# Patient Record
Sex: Female | Born: 1965 | Race: White | Hispanic: No | Marital: Married | Smoking: Former smoker
Health system: Southern US, Community
[De-identification: ages and names within clinical notes are randomized; demographics above are authoritative.]

## PROBLEM LIST (undated history)

## (undated) DIAGNOSIS — F32A Depression, unspecified: Secondary | ICD-10-CM

## (undated) DIAGNOSIS — F329 Major depressive disorder, single episode, unspecified: Secondary | ICD-10-CM

## (undated) DIAGNOSIS — I10 Essential (primary) hypertension: Secondary | ICD-10-CM

## (undated) HISTORY — PX: OTHER SURGICAL HISTORY: SHX169

## (undated) HISTORY — DX: Essential (primary) hypertension: I10

## (undated) HISTORY — PX: CHOLECYSTECTOMY: SHX55

---

## 2000-07-22 ENCOUNTER — Other Ambulatory Visit: Admission: RE | Admit: 2000-07-22 | Discharge: 2000-07-22 | Payer: Self-pay | Admitting: *Deleted

## 2001-09-06 ENCOUNTER — Encounter: Payer: Self-pay | Admitting: Family Medicine

## 2001-09-06 ENCOUNTER — Inpatient Hospital Stay (HOSPITAL_COMMUNITY): Admission: EM | Admit: 2001-09-06 | Discharge: 2001-09-08 | Payer: Self-pay | Admitting: Internal Medicine

## 2001-09-07 ENCOUNTER — Encounter: Payer: Self-pay | Admitting: Family Medicine

## 2002-01-24 ENCOUNTER — Emergency Department (HOSPITAL_COMMUNITY): Admission: EM | Admit: 2002-01-24 | Discharge: 2002-01-24 | Payer: Self-pay | Admitting: Emergency Medicine

## 2003-03-28 ENCOUNTER — Ambulatory Visit (HOSPITAL_COMMUNITY): Admission: RE | Admit: 2003-03-28 | Discharge: 2003-03-28 | Payer: Self-pay | Admitting: Internal Medicine

## 2005-01-11 ENCOUNTER — Emergency Department (HOSPITAL_COMMUNITY): Admission: EM | Admit: 2005-01-11 | Discharge: 2005-01-11 | Payer: Self-pay | Admitting: *Deleted

## 2006-11-27 ENCOUNTER — Other Ambulatory Visit: Admission: RE | Admit: 2006-11-27 | Discharge: 2006-11-27 | Payer: Self-pay | Admitting: Obstetrics & Gynecology

## 2007-08-24 ENCOUNTER — Ambulatory Visit: Payer: Self-pay | Admitting: Internal Medicine

## 2007-08-26 ENCOUNTER — Ambulatory Visit (HOSPITAL_COMMUNITY): Admission: RE | Admit: 2007-08-26 | Discharge: 2007-08-26 | Payer: Self-pay | Admitting: Internal Medicine

## 2008-02-12 ENCOUNTER — Ambulatory Visit (HOSPITAL_COMMUNITY): Admission: RE | Admit: 2008-02-12 | Discharge: 2008-02-12 | Payer: Self-pay | Admitting: Family Medicine

## 2010-02-25 ENCOUNTER — Encounter: Payer: Self-pay | Admitting: Family Medicine
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
## 2010-06-22 NOTE — Op Note (Signed)
NAME:  Julie Christensen, Julie Christensen                          ACCOUNT NO.:  [id number]   MEDICAL RECORD NO.:  [id number]                   PATIENT TYPE:  AMB   LOCATION:  DAY                                  FACILITY:  [facility]   PHYSICIAN:  Lionel December, M.D.                 DATE OF BIRTH:  27-Apr-1965   DATE OF PROCEDURE:  03/28/2003  DATE OF DISCHARGE:                                 OPERATIVE REPORT   PROCEDURE:  Total colonoscopy.   ENDOSCOPIST:  Lionel December, M.D.   INDICATIONS:  This patient is 45 year old Caucasian female who has noted  change in her bowel habits.  She is noted to have heme-positive stools.  She  is, therefore, undergoing a diagnostic colonoscopy  Family history is  negative for colorectal carcinoma or inflammatory bowel disease.  Procedure  and risks were reviewed with the patient and informed consent was obtained.   PREOPERATIVE MEDICATIONS:  Demerol 50 mg IV and Versed 6 mg IV in divided  dose.   FINDINGS:  Procedure performed in endoscopy suite.  The patient's vital  signs and O2 saturation were monitored during the procedure and remained  stable.  The patient was placed in the left lateral recumbent position and  rectal examination was performed.  She had a small tag anteriorly.  Digital  exam was otherwise normal.   The scope was placed in the rectum and advanced under vision into the  sigmoid colon and beyond.  Preparation was satisfactory.  The scope was  passed to the cecum which was identified by ileocecal valve and appendiceal  orifice.  A short segment of TL was also examined and was normal.  The  colonic mucosa was once again carefully examined on the way out and was  normal throughout. The rectal mucosa similarly was normal.   The scope was retroflexed to examine the anorectal junction and small  hemorrhoids were noted below the dentate line.  The endoscope was  straightened and withdrawn.  The patient tolerated the procedure well.   FINAL DIAGNOSES:  1.  Small external hemorrhoids, otherwise normal colonoscopy and terminal     ileoscopy.  2. Suspect that she has irritable bowel syndrome.   RECOMMENDATIONS:  1. High fiber diet.  2. Citrucel 1 tablespoonful daily.  3. Colace 1-2 tablets at bedtime.  4. If this regimen does not help could try low-dose Zelnorm.      ___________________________________________                                            Lionel December, M.D.   NR/MEDQ  D:  03/28/2003  T:  03/28/2003  Job:  [id number]   cc:   Kirk Ruths, M.D.  [address]  [place]  [place] [address]  Fax: [phone]

## 2010-06-22 NOTE — Discharge Summary (Signed)
   NAME:  Julie Christensen, Julie Christensen                          ACCOUNT NO.:  [id number]   MEDICAL RECORD NO.:  [id number]                   PATIENT TYPE:  INP   LOCATION:  A305                                 FACILITY:  [facility]   PHYSICIAN:  Dalia Heading, M.D.               DATE OF BIRTH:  Mar 13, 1965   DATE OF ADMISSION:  DATE OF DISCHARGE:  09/08/2001                                 DISCHARGE SUMMARY   HOSPITAL COURSE:  The patient is a 45 year old white female who presented to  the hospital with right upper quadrant pain, nausea and vomiting.  She was  noted on CT scan of the abdomen to have cholelithiasis without common bile  duct dilatation. She also was noted to have mild chemical pancreatitis.  A  surgery consultation was obtained and the patient was taken to the operating  room on 09/07/01 and underwent a laparoscopic cholecystectomy with  cholangiograms.  The cholangiograms were negative for a common bile duct  stone.  She tolerated the procedure well. Her postoperative course was  unremarkable.  Her diet was advanced without difficulty.   The patient is being discharged home on postoperative day 1 in good and  improving condition.   DISCHARGE INSTRUCTIONS:  The patient is to follow up with Dr. Franky Macho  on 09/15/01.   DISCHARGE MEDICATIONS:  Percocet 7.5 mg 1-2 tablets p.o. q.6h. p.r.n. pain,  dispense 40, no refills.   PRINCIPAL DIAGNOSIS:  1. Cholecystitis, cholelithiasis.  2. Pancreatitis.   PRINCIPAL PROCEDURE:  Laparoscopic cholecystectomy with cholangiograms on  09/07/01.                                               Dalia Heading, M.D.    MAJ/MEDQ  D:  09/08/2001  T:  09/12/2001  Job:  [id number]   cc:   Jonell Cluck, M.D.

## 2010-06-22 NOTE — H&P (Signed)
   NAME:  Julie Christensen, Julie Christensen                          ACCOUNT NO.:  [id number]   MEDICAL RECORD NO.:  [id number]                   PATIENT TYPE:  INP   LOCATION:  A305                                 FACILITY:  [facility]   PHYSICIAN:  Jonell Cluck, M.D.              DATE OF BIRTH:  03/09/1965   DATE OF ADMISSION:  09/06/2001  DATE OF DISCHARGE:                                HISTORY & PHYSICAL   CHIEF COMPLAINT:  Abdominal pain.   HISTORY OF PRESENT ILLNESS:  This is a 44 year old female with an  essentially negative past history.  Her only medications at this time are  oral contraceptives.   The patient presented to the emergency department with the acute onset of  epigastric and right upper quadrant pain radiating to her right subscapular  region several hours after ingesting cheese fries and a large glass of milk.  She experienced nausea and vomiting and increasing severe pain and was  brought to the emergency room by her family.  Workup there was essentially  negative.  She continued to experience pain and was admitted for further  evaluation therapy.   There is no history of having neurologic deficits, chest pain, shortness of  breath, melena, hematemesis, hematochezia, genitourinary symptoms, or other  symptoms.  She does relate approximately one-year history of intermittent  right upper quadrant abdominal pain with no particular pattern.   CURRENT MEDICATIONS:  Oral contraceptives.   ALLERGIES:  SULFA.   PAST MEDICAL HISTORY:  Essentially benign.   FAMILY HISTORY:  Positive for gallbladder disease in a 4 year old brother.   REVIEW OF SYSTEMS:  Negative symptoms mentioned.   SOCIAL HISTORY:  She is a smoker and occasional alcohol only.   PHYSICAL EXAMINATION:  GENERAL:  Very pleasant female in no acute distress.  VITAL SIGNS:  On presentation, temperature 97.3, pulse 61, respirations 16,  blood pressure 129/89.  HEENT:  Normocephalic, atraumatic.  There is no icterus.   Ears, nose, and  throat are benign.  NECK:  Supple.  LUNGS:  Clear.  HEART:  Heart sounds are normal without murmurs, rubs, or gallops.  ABDOMEN:  Moderate epigastric tenderness with a positive Murphy's sign.  There is no significant guarding or rebound.  Bowel sounds are intact.  EXTREMITIES:  No clubbing, cyanosis, or edema.  NEUROLOGICAL:  Without focal deficits.   ASSESSMENT:  Probable biliary colic.   PLAN:  CT scan of the abdomen.  Possible surgical consult.  Ultrasound if  needed.  Will follow and treat.                                               Jonell Cluck, M.D.    MSC/MEDQ  D:  09/07/2001  T:  09/08/2001  Job:  [id number]

## 2010-06-22 NOTE — Op Note (Signed)
NAME:  Julie Christensen, Julie Christensen                          ACCOUNT NO.:  [id number]   MEDICAL RECORD NO.:  [id number]                   PATIENT TYPE:  INP   LOCATION:  A305                                 FACILITY:  [facility]   PHYSICIAN:  Dalia Heading, M.D.               DATE OF BIRTH:  11-25-1965   DATE OF PROCEDURE:  09/07/2001  DATE OF DISCHARGE:                                 OPERATIVE REPORT   PREOPERATIVE DIAGNOSES:  Acute cholecystitis, cholelithiasis, pancreatitis.   POSTOPERATIVE DIAGNOSES:  Acute cholecystitis, cholelithiasis, pancreatitis.   PROCEDURE:  Laparoscopic cholecystectomy with cholangiogram.   SURGEON:  Dalia Heading, M.D.   ASSISTANT:  Bernerd Limbo. Leona Carry, M.D.   ANESTHESIA:  General endotracheal.   INDICATIONS FOR PROCEDURE:  The patient is a 45 year old white female who  presented yesterday with nausea, vomiting, and right upper quadrant  abdominal pain. She was noted to have mild pancreatitis with elevated  amylase. CT scan of the abdomen and pelvis revealed cholelithiasis. The  risks and benefits of the procedure including bleeding, infection,  hepatobiliary injury, and the possibility of an open procedure were fully  explained to the patient, who gave informed consent.   DESCRIPTION OF PROCEDURE:  The patient was placed in the supine position.  After induction general endotracheal anesthesia, the abdomen was prepped and  draped using the usual sterile technique with Betadine.   A supraumbilical incision was made down to the fascia. A Veress needle was  introduced into the abdominal cavity and confirmation of placement was done  using the saline drop test. The abdomen was entered, inflated to 16 mmHg  pressure. An 11 mm trocar was introduced into the abdominal cavity under  direct visualization without difficulty. The patient was placed in reverse  Trendelenburg position and an additional 11 mm trocar was placed in the  epigastric region and 5 mm trocars were  placed in the right upper quadrant  and right flank regions. The liver was inspected and noted to be within  normal limits. The gallbladder was retracted superior and laterally. The  dissection was begun around the infundibulum of the gallbladder. The cystic  duct was first identified. Its juncture to the infundibulum fully  identified. A single Endoclip was placed proximally on the cystic duct. An  incision was made into the cystic duct and the cholangiocatheter was then  inserted. Under fluoroscopy, cholangiograms were performed. The  hepatobiliary tree appeared to be within normal limits. No filling defect  was noted. The dye flowed freely into the duodenum. The cholangiocatheter  was removed after the system was flushed with saline. Endoclips were placed  distally on the cystic duct and cystic duct was divided. The cystic artery  was likewise ligated and divided. The gallbladder was freed away from the  gallbladder fossa using Bovie electrocautery. The gallbladder was delivered  through the epigastric trocar site using an EndoCatch bag without  difficulty. The  gallbladder fossa was inspected and no abnormal bleeding  bile leakage was noted. Surgicel was placed in the gallbladder fossa. The  subhepatic space as well as the right hepatic gutter were irrigated with  normal saline. All fluid and air were then evacuated from the abdominal  cavity prior to removal of the trocars.   All wounds were irrigated with normal saline. All wounds were injected with  0.5% Sensorcaine. The supraumbilical fascia was reapproximated using an #0  Vicryl interrupted suture. All skin incisions were closed using staples.  Betadine ointment and dry sterile dressings were applied.   All tape and needle counts were correct at the end of the procedure. The  patient was extubated in the operating room and went back to the recovery  room awake in stable condition.   COMPLICATIONS:  None.   SPECIMENS:   Gallbladder with stones.   ESTIMATED BLOOD LOSS:  Minimal.                                               Dalia Heading, M.D.    MAJ/MEDQ  D:  09/07/2001  T:  09/11/2001  Job:  [id number]   cc:   Jonell Cluck, M.D.
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
## 2012-06-02 ENCOUNTER — Encounter (HOSPITAL_COMMUNITY): Payer: Self-pay | Admitting: Emergency Medicine

## 2012-06-02 ENCOUNTER — Emergency Department (HOSPITAL_COMMUNITY): Payer: BC Managed Care – PPO

## 2012-06-02 ENCOUNTER — Emergency Department (HOSPITAL_COMMUNITY)
Admission: EM | Admit: 2012-06-02 | Discharge: 2012-06-02 | Disposition: A | Discharge status: Home / Self Care | Payer: BC Managed Care – PPO | Attending: Emergency Medicine | Admitting: Emergency Medicine

## 2012-06-02 DIAGNOSIS — Z79899 Other long term (current) drug therapy: Secondary | ICD-10-CM | POA: Insufficient documentation

## 2012-06-02 DIAGNOSIS — K589 Irritable bowel syndrome without diarrhea: Secondary | ICD-10-CM | POA: Insufficient documentation

## 2012-06-02 DIAGNOSIS — K5900 Constipation, unspecified: Secondary | ICD-10-CM | POA: Insufficient documentation

## 2012-06-02 DIAGNOSIS — R5381 Other malaise: Secondary | ICD-10-CM | POA: Insufficient documentation

## 2012-06-02 DIAGNOSIS — R110 Nausea: Secondary | ICD-10-CM | POA: Insufficient documentation

## 2012-06-02 DIAGNOSIS — Z8659 Personal history of other mental and behavioral disorders: Secondary | ICD-10-CM | POA: Insufficient documentation

## 2012-06-02 DIAGNOSIS — R141 Gas pain: Secondary | ICD-10-CM | POA: Insufficient documentation

## 2012-06-02 DIAGNOSIS — K921 Melena: Secondary | ICD-10-CM | POA: Insufficient documentation

## 2012-06-02 DIAGNOSIS — Z3202 Encounter for pregnancy test, result negative: Secondary | ICD-10-CM | POA: Insufficient documentation

## 2012-06-02 DIAGNOSIS — R197 Diarrhea, unspecified: Secondary | ICD-10-CM | POA: Insufficient documentation

## 2012-06-02 DIAGNOSIS — R142 Eructation: Secondary | ICD-10-CM | POA: Insufficient documentation

## 2012-06-02 DIAGNOSIS — F17200 Nicotine dependence, unspecified, uncomplicated: Secondary | ICD-10-CM | POA: Insufficient documentation

## 2012-06-02 HISTORY — DX: Depression, unspecified: F32.A

## 2012-06-02 HISTORY — DX: Major depressive disorder, single episode, unspecified: F32.9

## 2012-06-02 LAB — URINALYSIS, ROUTINE W REFLEX MICROSCOPIC
Bilirubin Urine: NEGATIVE
Glucose, UA: NEGATIVE mg/dL
Hgb urine dipstick: NEGATIVE
Specific Gravity, Urine: 1.007 (ref 1.005–1.030)
pH: 6 (ref 5.0–8.0)

## 2012-06-02 LAB — COMPREHENSIVE METABOLIC PANEL
ALT: 11 U/L (ref 0–35)
AST: 18 U/L (ref 0–37)
Albumin: 3.6 g/dL (ref 3.5–5.2)
Alkaline Phosphatase: 49 U/L (ref 39–117)
Glucose, Bld: 115 mg/dL — ABNORMAL HIGH (ref 70–99)
Potassium: 3.8 mEq/L (ref 3.5–5.1)
Sodium: 138 mEq/L (ref 135–145)
Total Protein: 7.5 g/dL (ref 6.0–8.3)

## 2012-06-02 LAB — CBC WITH DIFFERENTIAL/PLATELET
Basophils Absolute: 0 10*3/uL (ref 0.0–0.1)
Eosinophils Absolute: 0.1 10*3/uL (ref 0.0–0.7)
Lymphs Abs: 2.8 10*3/uL (ref 0.7–4.0)
MCH: 31.1 pg (ref 26.0–34.0)
Neutrophils Relative %: 56 % (ref 43–77)
Platelets: 235 10*3/uL (ref 150–400)
RBC: 5.11 MIL/uL (ref 3.87–5.11)
RDW: 12.7 % (ref 11.5–15.5)
WBC: 7.4 10*3/uL (ref 4.0–10.5)

## 2012-06-02 LAB — POCT PREGNANCY, URINE: Preg Test, Ur: NEGATIVE

## 2012-06-02 LAB — LACTIC ACID, PLASMA: Lactic Acid, Venous: 1.3 mmol/L (ref 0.5–2.2)

## 2012-06-02 MED ORDER — MORPHINE SULFATE 4 MG/ML IJ SOLN
4.0000 mg | Freq: Once | INTRAMUSCULAR | Status: AC
  Administered 2012-06-02: 4 mg via INTRAVENOUS
  Filled 2012-06-02: qty 1

## 2012-06-02 MED ORDER — ONDANSETRON 4 MG PO TBDP
ORAL_TABLET | ORAL | Status: AC
  Administered 2012-06-02: 4 mg
  Filled 2012-06-02: qty 1

## 2012-06-02 MED ORDER — DICYCLOMINE HCL 20 MG PO TABS: 20.0000 mg | ORAL_TABLET | Freq: Four times a day (QID) | ORAL | Status: DC

## 2012-06-02 MED ORDER — IOHEXOL 300 MG/ML  SOLN
100.0000 mL | Freq: Once | INTRAMUSCULAR | Status: AC | PRN
  Administered 2012-06-02: 100 mL via INTRAVENOUS

## 2012-06-02 MED ORDER — IOHEXOL 300 MG/ML  SOLN
50.0000 mL | Freq: Once | INTRAMUSCULAR | Status: AC | PRN
  Administered 2012-06-02: 50 mL via ORAL

## 2012-06-02 MED ORDER — SODIUM CHLORIDE 0.9 % IV BOLUS (SEPSIS)
1000.0000 mL | Freq: Once | INTRAVENOUS | Status: AC
  Administered 2012-06-02: 1000 mL via INTRAVENOUS

## 2012-06-02 NOTE — ED Provider Notes (Signed)
I saw and evaluated the patient, reviewed the resident's note and I agree with the findings and plan. On my exam this patient with a long history of abdominal pain, hemorrhoids was in no distress, hemodynamically stable.  The patient had a largely unremarkable evaluation, was discharged with primary care and gastroenterology followup.  Gerhard Munch, MD 06/02/12 2001

## 2012-06-02 NOTE — ED Notes (Signed)
States has either constipation or diarrhea x 1 year states it keeps getting worse  Has hemmorrids  Major gas

## 2012-06-02 NOTE — ED Notes (Signed)
Also states that she has had shooting pains in her vagina

## 2012-06-02 NOTE — Discharge Instructions (Signed)
Diet and Irritable Bowel Syndrome  °No cure has been found for irritable bowel syndrome (IBS). Many options are available to treat the symptoms. Your caregiver will give you the best treatments available for your symptoms. He or she will also encourage you to manage stress and to make changes to your diet. You need to work with your caregiver and Registered Dietician to find the best combination of medicine, diet, counseling, and support to control your symptoms. The following are some diet suggestions. °FOODS THAT MAKE IBS WORSE °· Fatty foods, such as French fries. °· Milk products, such as cheese or ice cream. °· Chocolate. °· Alcohol. °· Caffeine (found in coffee and some sodas). °· Carbonated drinks, such as soda. °If certain foods cause symptoms, you should eat less of them or stop eating them. °FOOD JOURNAL  °· Keep a journal of the foods that seem to cause distress. Write down: °· What you are eating during the day and when. °· What problems you are having after eating. °· When the symptoms occur in relation to your meals. °· What foods always make you feel badly. °· Take your notes with you to your caregiver to see if you should stop eating certain foods. °FOODS THAT MAKE IBS BETTER °Fiber reduces IBS symptoms, especially constipation, because it makes stools soft, bulky, and easier to pass. Fiber is found in bran, bread, cereal, beans, fruit, and vegetables. Examples of foods with fiber include: °· Apples. °· Peaches. °· Pears. °· Berries. °· Figs. °· Broccoli, raw. °· Cabbage. °· Carrots. °· Raw peas. °· Kidney beans. °· Lima beans. °· Whole-grain bread. °· Whole-grain cereal. °Add foods with fiber to your diet a little at a time. This will let your body get used to them. Too much fiber at once might cause gas and swelling of your abdomen. This can trigger symptoms in a person with IBS. Caregivers usually recommend a diet with enough fiber to produce soft, painless bowel movements. High fiber diets may  cause gas and bloating. However, these symptoms often go away within a few weeks, as your body adjusts. °In many cases, dietary fiber may lessen IBS symptoms, particularly constipation. However, it may not help pain or diarrhea. High fiber diets keep the colon mildly enlarged (distended) with the added fiber. This may help prevent spasms in the colon. Some forms of fiber also keep water in the stool, thereby preventing hard stools that are difficult to pass.  °Besides telling you to eat more foods with fiber, your caregiver may also tell you to get more fiber by taking a fiber pill or drinking water mixed with a special high fiber powder. An example of this is a natural fiber laxative containing psyllium seed.  °TIPS °· Large meals can cause cramping and diarrhea in people with IBS. If this happens to you, try eating 4 or 5 small meals a day, or try eating less at each of your usual 3 meals. It may also help if your meals are low in fat and high in carbohydrates. Examples of carbohydrates are pasta, rice, whole-grain breads and cereals, fruits, and vegetables. °· If dairy products cause your symptoms to flare up, you can try eating less of those foods. You might be able to handle yogurt better than other dairy products, because it contains bacteria that helps with digestion. Dairy products are an important source of calcium and other nutrients. If you need to avoid dairy products, be sure to talk with a Registered Dietitian about getting these nutrients   through other food sources.  Drink enough water and fluids to keep your urine clear or pale yellow. This is important, especially if you have diarrhea. FOR MORE INFORMATION  International Foundation for Functional Gastrointestinal Disorders: www.iffgd.org  National Digestive Diseases Information Clearinghouse: digestive.StageSync.si Document Released: 04/13/2003 Document Revised: 04/15/2011 Document Reviewed: 12/29/2006 [facility] Patient Information 2013  [place], [place].  Irritable Bowel Syndrome Irritable Bowel Syndrome (IBS) is caused by a disturbance of normal bowel function. Other terms used are spastic colon, mucous colitis, and irritable colon. It does not require surgery, nor does it lead to cancer. There is no cure for IBS. But with proper diet, stress reduction, and medication, you will find that your problems (symptoms) will gradually disappear or improve. IBS is a common digestive disorder. It usually appears in late adolescence or early adulthood. Women develop it twice as often as men. CAUSES  After food has been digested and absorbed in the small intestine, waste material is moved into the colon (large intestine). In the colon, water and salts are absorbed from the undigested products coming from the small intestine. The remaining residue, or fecal material, is held for elimination. Under normal circumstances, gentle, rhythmic contractions on the bowel walls push the fecal material along the colon towards the rectum. In IBS, however, these contractions are irregular and poorly coordinated. The fecal material is either retained too long, resulting in constipation, or expelled too soon, producing diarrhea. SYMPTOMS  The most common symptom of IBS is pain. It is typically in the lower left side of the belly (abdomen). But it may occur anywhere in the abdomen. It can be felt as heartburn, backache, or even as a dull pain in the arms or shoulders. The pain comes from excessive bowel-muscle spasms and from the buildup of gas and fecal material in the colon. This pain:  Can range from sharp belly (abdominal) cramps to a dull, continuous ache.  Usually worsens soon after eating.  Is typically relieved by having a bowel movement or passing gas. Abdominal pain is usually accompanied by constipation. But it may also produce diarrhea. The diarrhea typically occurs right after a meal or upon arising in the morning. The stools are typically soft and  watery. They are often flecked with secretions (mucus). Other symptoms of IBS include:  Bloating.  Loss of appetite.  Heartburn.  Feeling sick to your stomach (nausea).  Belching  Vomiting  Gas. IBS may also cause a number of symptoms that are unrelated to the digestive system:  Fatigue.  Headaches.  Anxiety  Shortness of breath  Difficulty in concentrating.  Dizziness. These symptoms tend to come and go. DIAGNOSIS  The symptoms of IBS closely mimic the symptoms of other, more serious digestive disorders. So your caregiver may wish to perform a variety of additional tests to exclude these disorders. He/she wants to be certain of learning what is wrong (diagnosis). The nature and purpose of each test will be explained to you. TREATMENT A number of medications are available to help correct bowel function and/or relieve bowel spasms and abdominal pain. Among the drugs available are:  Mild, non-irritating laxatives for severe constipation and to help restore normal bowel habits.  Specific anti-diarrheal medications to treat severe or prolonged diarrhea.  Anti-spasmodic agents to relieve intestinal cramps.  Your caregiver may also decide to treat you with a mild tranquilizer or sedative during unusually stressful periods in your life. The important thing to remember is that if any drug is prescribed for you, make sure that you  take it exactly as directed. Make sure that your caregiver knows how well it worked for you. HOME CARE INSTRUCTIONS   Avoid foods that are high in fat or oils. Some examples JXB:JYNWG cream, butter, frankfurters, sausage, and other fatty meats.  Avoid foods that have a laxative effect, such as fruit, fruit juice, and dairy products.  Cut out carbonated drinks, chewing gum, and "gassy" foods, such as beans and cabbage. This may help relieve bloating and belching.  Bran taken with plenty of liquids may help relieve constipation.  Keep track of what  foods seem to trigger your symptoms.  Avoid emotionally charged situations or circumstances that produce anxiety.  Start or continue exercising.  Get plenty of rest and sleep. MAKE SURE YOU:   Understand these instructions.  Will watch your condition.  Will get help right away if you are not doing well or get worse. Document Released: 01/21/2005 Document Revised: 04/15/2011 Document Reviewed: 09/11/2007 [facility] Patient Information 2013 [place], [place].  High-Fiber Diet Fiber is found in fruits, vegetables, and grains. A high-fiber diet encourages the addition of more whole grains, legumes, fruits, and vegetables in your diet. The recommended amount of fiber for adult males is 38 g per day. For adult females, it is 25 g per day. Pregnant and lactating women should get 28 g of fiber per day. If you have a digestive or bowel problem, ask your caregiver for advice before adding high-fiber foods to your diet. Eat a variety of high-fiber foods instead of only a select few type of foods.  PURPOSE  To increase stool bulk.  To make bowel movements more regular to prevent constipation.  To lower cholesterol.  To prevent overeating. WHEN IS THIS DIET USED?  It may be used if you have constipation and hemorrhoids.  It may be used if you have uncomplicated diverticulosis (intestine condition) and irritable bowel syndrome.  It may be used if you need help with weight management.  It may be used if you want to add it to your diet as a protective measure against atherosclerosis, diabetes, and cancer. SOURCES OF FIBER  Whole-grain breads and cereals.  Fruits, such as apples, oranges, bananas, berries, prunes, and pears.  Vegetables, such as green peas, carrots, sweet potatoes, beets, broccoli, cabbage, spinach, and artichokes.  Legumes, such split peas, soy, lentils.  Almonds. FIBER CONTENT IN FOODS Starches and Grains / Dietary Fiber (g)  Cheerios, 1 cup / 3 g  Corn Flakes  cereal, 1 cup / 0.7 g  Rice crispy treat cereal, 1 cup / 0.3 g  Instant oatmeal (cooked),  cup / 2 g  Frosted wheat cereal, 1 cup / 5.1 g  Brown, long-grain rice (cooked), 1 cup / 3.5 g  White, long-grain rice (cooked), 1 cup / 0.6 g  Enriched macaroni (cooked), 1 cup / 2.5 g Legumes / Dietary Fiber (g)  Baked beans (canned, plain, or vegetarian),  cup / 5.2 g  Kidney beans (canned),  cup / 6.8 g  Pinto beans (cooked),  cup / 5.5 g Breads and Crackers / Dietary Fiber (g)  Plain or honey graham crackers, 2 squares / 0.7 g  Saltine crackers, 3 squares / 0.3 g  Plain, salted pretzels, 10 pieces / 1.8 g  Whole-wheat bread, 1 slice / 1.9 g  White bread, 1 slice / 0.7 g  Raisin bread, 1 slice / 1.2 g  Plain bagel, 3 oz / 2 g  Flour tortilla, 1 oz / 0.9 g  Corn tortilla, 1 small /  1.5 g  Hamburger or hotdog bun, 1 small / 0.9 g Fruits / Dietary Fiber (g)  Apple with skin, 1 medium / 4.4 g  Sweetened applesauce,  cup / 1.5 g  Banana,  medium / 1.5 g  Grapes, 10 grapes / 0.4 g  Orange, 1 small / 2.3 g  Raisin, 1.5 oz / 1.6 g  Melon, 1 cup / 1.4 g Vegetables / Dietary Fiber (g)  Green beans (canned),  cup / 1.3 g  Carrots (cooked),  cup / 2.3 g  Broccoli (cooked),  cup / 2.8 g  Peas (cooked),  cup / 4.4 g  Mashed potatoes,  cup / 1.6 g  Lettuce, 1 cup / 0.5 g  Corn (canned),  cup / 1.6 g  Tomato,  cup / 1.1 g Document Released: 01/21/2005 Document Revised: 07/23/2011 Document Reviewed: 04/25/2011 [facility] Patient Information 2013 [place], [place].

## 2012-06-02 NOTE — ED Provider Notes (Signed)
History     CSN: [id number]  Arrival date & time 06/02/12  1240   First MD Initiated Contact with Patient 06/02/12 1506      Chief Complaint  Patient presents with  . Abdominal Pain    Patient is a 47 y.o. female presenting with abdominal pain. The history is provided by the patient and a friend.  Abdominal Pain Pain location:  Generalized (worse over epigastrum) Pain quality: aching and bloating   Pain quality comment:  "just hurts" Pain radiates to:  Does not radiate Pain severity:  Mild Onset quality:  Gradual Duration: 10 yrs; worst past week. Timing:  Constant Progression:  Waxing and waning Chronicity:  Chronic Context comment:  Hx of anxiety Associated symptoms: constipation, diarrhea, fatigue and nausea   Associated symptoms: no chest pain, no chills, no cough, no dysuria, no fever, no hematuria, no shortness of breath and no vomiting     Past Medical History  Diagnosis Date  . Depression     No past surgical history on file.  No family history on file.  History  Substance Use Topics  . Smoking status: Current Every Day Smoker  . Smokeless tobacco: Not on file  . Alcohol Use: No    OB History   Grav Para Term Preterm Abortions TAB SAB Ect Mult Living                  Review of Systems  Constitutional: Positive for appetite change and fatigue. Negative for fever, chills and activity change.  HENT: Negative for neck pain and neck stiffness.   Respiratory: Negative for cough, chest tightness, shortness of breath and wheezing.   Cardiovascular: Negative for chest pain and palpitations.  Gastrointestinal: Positive for nausea, abdominal pain, diarrhea, constipation and blood in stool (once). Negative for vomiting, abdominal distention and rectal pain.  Genitourinary: Negative for dysuria, hematuria, decreased urine volume and difficulty urinating.  Skin: Negative for rash and wound.  Neurological: Negative for syncope and light-headedness.   Psychiatric/Behavioral: Negative for confusion and agitation.  All other systems reviewed and are negative.    Allergies  Sulfa antibiotics and Wellbutrin  Home Medications   Current Outpatient Rx  Name  Route  Sig  Dispense  Refill  . hydrocortisone cream 1 %   Topical   Apply 1 application topically 3 (three) times daily. For hemorrhoids         . Norethin-Eth Estradiol-Fe (GENERESS FE PO)   Oral   Take 1 tablet by mouth daily.           BP 157/93  Pulse 112  Temp(Src) 97.9 F (36.6 C) (Oral)  Resp 22  SpO2 96%  Physical Exam  Nursing note and vitals reviewed. Constitutional: She is oriented to person, place, and time. She appears well-developed and well-nourished.  HENT:  Head: Normocephalic and atraumatic.  Right Ear: External ear normal.  Left Ear: External ear normal.  Nose: Nose normal.  Mouth/Throat: Oropharynx is clear and moist. No oropharyngeal exudate.  Eyes: Conjunctivae are normal. Pupils are equal, round, and reactive to light.  Neck: Normal range of motion. Neck supple.  Cardiovascular: Normal rate, regular rhythm, normal heart sounds and intact distal pulses.  Exam reveals no gallop and no friction rub.   No murmur heard. Pulmonary/Chest: Effort normal and breath sounds normal. No respiratory distress. She has no wheezes. She has no rales. She exhibits no tenderness.  Abdominal: Soft. Bowel sounds are normal. She exhibits no distension and no mass. There is  tenderness (mild diffusely, moderate over epigastrum). There is no rebound and no guarding.  Musculoskeletal: Normal range of motion. She exhibits no edema and no tenderness.  Neurological: She is alert and oriented to person, place, and time.  Skin: Skin is warm and dry.  Psychiatric: She has a normal mood and affect. Her behavior is normal. Judgment and thought content normal.    ED Course  Procedures (including critical care time)  Labs Reviewed  CBC WITH DIFFERENTIAL - Abnormal;  Notable for the following:    Hemoglobin 15.9 (*)    All other components within normal limits  COMPREHENSIVE METABOLIC PANEL - Abnormal; Notable for the following:    Glucose, Bld 115 (*)    All other components within normal limits  LIPASE, BLOOD  URINALYSIS, ROUTINE W REFLEX MICROSCOPIC  LACTIC ACID, PLASMA  POCT PREGNANCY, URINE   Ct Abdomen Pelvis W Contrast  06/02/2012  *RADIOLOGY REPORT*  Clinical Data: Upper abdominal pain.  Diarrhea.  Previous cholecystectomy.  CT ABDOMEN AND PELVIS WITH CONTRAST  Technique:  Multidetector CT imaging of the abdomen and pelvis was performed following the standard protocol during bolus administration of intravenous contrast.  Contrast: OMNIPAQUE IOHEXOL 300 MG/ML  SOLN  Comparison: 09/06/2001.  Findings: Mild diffuse low density of the liver relative to the spleen.  Cholecystectomy clips. Normally opacified and normal appearing appendix.  Normal appearing spleen, pancreas, adrenal glands, kidneys, urinary bladder, uterus and right ovary.  The left ovary is not well visualized, with no gross abnormality.  No gastrointestinal abnormalities or enlarged lymph nodes.  Mild dependent atelectasis at both lung bases.  Minimal lumbar spine degenerative changes.  IMPRESSION:  1.  No acute abnormality. 2.  Mild hepatic steatosis.   Original Report Authenticated By: Beckie Salts, M.D.    Dg Abd Acute W/chest  06/02/2012  *RADIOLOGY REPORT*  Clinical Data: Abdominal pain and diarrhea.  ACUTE ABDOMEN SERIES (ABDOMEN 2 VIEW & CHEST 1 VIEW)  Comparison: None  Findings: The cardiomediastinal silhouette is unremarkable. The lungs are clear. There is no evidence of focal airspace disease, pulmonary edema, suspicious pulmonary nodule/mass, pleural effusion, or pneumothorax. No acute bony abnormalities are identified.  A moderate amount of stool is present. There is no evidence of bowel obstruction or pneumoperitoneum. No suspicious calcifications are present. Cholecystectomy  clips are identified. No bony abnormalities are noted.  IMPRESSION: No evidence of acute abnormality.  Unremarkable bowel gas pattern.   Original Report Authenticated By: Harmon Pier, M.D.      1. IBS (irritable bowel syndrome)       MDM  47 yo F presents for 10 yrs of migratory abdominal pain; worse overlying epigastrum. Associated constipation switching back and forth with diarrhea. Has used suppositories with some relief of constipation. One episode of hematochezia last week. Hx of cholecystectomy 10 yrs ago. Denies vaginal bleeding or discharge. Patient's friend states that "this is not IBS; we are sick of her being diagnosed with IBS." Afebrile. Soft abdomen. TTP localized to epigastrum on my exam. Hemoccult negative stool without evidence of external hemorrhoids. Labs not c/w acute pancreatitis or hepatitis. U/A. Urine hCG. Clinical picture not c/w ectopic pregnancy, TOA, or ovarian torsion. CT of abdomen/pelvis obtained as the pain has not been worked up in the past with imaging. Analgesia also provided.  Imaging negative for evidence of colitis, intra-abdominal abscess, obstruction, acute appendicitis, or pancreatitis. Patient's pain improved with symptomatic treatment in ED. Suspect IBS as cause of symptoms. Patient counseled at length about dietary modifications, increased exercise,  and stress reduction techniques to help with constipation. Patient states Miralax has not helped in past. Will prescribe Bentyl for symptoms. Patient given return precautions, including worsening of signs or symptoms. Patient instructed to follow-up with primary care physician. GI contact number also provided.             Clemetine Marker, MD 06/02/12 [phone]

## 2015-06-26 ENCOUNTER — Ambulatory Visit (INDEPENDENT_AMBULATORY_CARE_PROVIDER_SITE_OTHER): Payer: BC Managed Care – PPO | Admitting: Orthopedic Surgery

## 2015-06-26 ENCOUNTER — Ambulatory Visit (INDEPENDENT_AMBULATORY_CARE_PROVIDER_SITE_OTHER): Payer: BC Managed Care – PPO

## 2015-06-26 ENCOUNTER — Encounter: Payer: Self-pay | Admitting: Orthopedic Surgery

## 2015-06-26 VITALS — BP 141/90 | Ht 62.0 in | Wt 153.0 lb

## 2015-06-26 DIAGNOSIS — M79672 Pain in left foot: Secondary | ICD-10-CM

## 2015-06-26 DIAGNOSIS — M722 Plantar fascial fibromatosis: Secondary | ICD-10-CM | POA: Diagnosis not present

## 2015-06-26 NOTE — Patient Instructions (Addendum)
You have received an injection of steroids into the joint. 15% of patients will have increased pain within the 24 hours postinjection.   This is transient and will go away.   We recommend that you use ice packs on the injection site for 20 minutes every 2 hours and extra strength Tylenol 2 tablets every 8 as needed until the pain resolves.  If you continue to have pain after taking the Tylenol and using the ice please call the office for further instructions.  Continue exercises for plantar fasciitis heel stretching Continue ice therapy Continue Aleve or ibuprofen Continue wearing her boot at night  Plantar Fasciitis With Rehab The plantar fascia is a fibrous, ligament-like, soft-tissue structure that spans the bottom of the foot. Plantar fasciitis, also called heel spur syndrome, is a condition that causes pain in the foot due to inflammation of the tissue. SYMPTOMS   Pain and tenderness on the underneath side of the foot.  Pain that worsens with standing or walking. CAUSES  Plantar fasciitis is caused by irritation and injury to the plantar fascia on the underneath side of the foot. Common mechanisms of injury include:  Direct trauma to bottom of the foot.  Damage to a small nerve that runs under the foot where the main fascia attaches to the heel bone.  Stress placed on the plantar fascia due to bone spurs. RISK INCREASES WITH:   Activities that place stress on the plantar fascia (running, jumping, pivoting, or cutting).  Poor strength and flexibility.  Improperly fitted shoes.  Tight calf muscles.  Flat feet.  Failure to warm-up properly before activity.  Obesity. PREVENTION  Warm up and stretch properly before activity.  Allow for adequate recovery between workouts.  Maintain physical fitness:  Strength, flexibility, and endurance.  Cardiovascular fitness.  Maintain a health body weight.  Avoid stress on the plantar fascia.  Wear properly fitted shoes,  including arch supports for individuals who have flat feet. PROGNOSIS  If treated properly, then the symptoms of plantar fasciitis usually resolve without surgery. However, occasionally surgery is necessary. RELATED COMPLICATIONS   Recurrent symptoms that may result in a chronic condition.  Problems of the lower back that are caused by compensating for the injury, such as limping.  Pain or weakness of the foot during push-off following surgery.  Chronic inflammation, scarring, and partial or complete fascia tear, occurring more often from repeated injections. TREATMENT  Treatment initially involves the use of ice and medication to help reduce pain and inflammation. The use of strengthening and stretching exercises may help reduce pain with activity, especially stretches of the Achilles tendon. These exercises may be performed at home or with a therapist. Your caregiver may recommend that you use heel cups of arch supports to help reduce stress on the plantar fascia. Occasionally, corticosteroid injections are given to reduce inflammation. If symptoms persist for greater than 6 months despite non-surgical (conservative), then surgery may be recommended.  MEDICATION   If pain medication is necessary, then nonsteroidal anti-inflammatory medications, such as aspirin and ibuprofen, or other minor pain relievers, such as acetaminophen, are often recommended.  Do not take pain medication within 7 days before surgery.  Prescription pain relievers may be given if deemed necessary by your caregiver. Use only as directed and only as much as you need.  Corticosteroid injections may be given by your caregiver. These injections should be reserved for the most serious cases, because they may only be given a certain number of times. HEAT AND COLD  Cold treatment (icing) relieves pain and reduces inflammation. Cold treatment should be applied for 10 to 15 minutes every 2 to 3 hours for inflammation and pain  and immediately after any activity that aggravates your symptoms. Use ice packs or massage the area with a piece of ice (ice massage).  Heat treatment may be used prior to performing the stretching and strengthening activities prescribed by your caregiver, physical therapist, or athletic trainer. Use a heat pack or soak the injury in warm water. SEEK IMMEDIATE MEDICAL CARE IF:  Treatment seems to offer no benefit, or the condition worsens.  Any medications produce adverse side effects. EXERCISES RANGE OF MOTION (ROM) AND STRETCHING EXERCISES - Plantar Fasciitis (Heel Spur Syndrome) These exercises may help you when beginning to rehabilitate your injury. Your symptoms may resolve with or without further involvement from your physician, physical therapist or athletic trainer. While completing these exercises, remember:   Restoring tissue flexibility helps normal motion to return to the joints. This allows healthier, less painful movement and activity.  An effective stretch should be held for at least 30 seconds.  A stretch should never be painful. You should only feel a gentle lengthening or release in the stretched tissue. RANGE OF MOTION - Toe Extension, Flexion  Sit with your right / left leg crossed over your opposite knee.  Grasp your toes and gently pull them back toward the top of your foot. You should feel a stretch on the bottom of your toes and/or foot.  Hold this stretch for ____3______ seconds.  Now, gently pull your toes toward the bottom of your foot. You should feel a stretch on the top of your toes and or foot.  Hold this stretch for _____3_____ seconds. Repeat ___10_______ times. Complete this stretch ____2______ times per day.    RANGE OF MOTION - Ankle Dorsiflexion, Active Assisted  Remove shoes and sit on a chair that is preferably not on a carpeted surface.  Place right / left foot under knee. Extend your opposite leg for support.  Keeping your heel down, slide  your right / left foot back toward the chair until you feel a stretch at your ankle or calf. If you do not feel a stretch, slide your bottom forward to the edge of the chair, while still keeping your heel down.  Hold this stretch for __________ seconds. Repeat __________ times. Complete this stretch __________ times per day.  STRETCH - Gastroc, Standing  Place hands on wall.  Extend right / left leg, keeping the front knee somewhat bent.  Slightly point your toes inward on your back foot.  Keeping your right / left heel on the floor and your knee straight, shift your weight toward the wall, not allowing your back to arch.  You should feel a gentle stretch in the right / left calf. Hold this position for __________ seconds. Repeat __________ times. Complete this stretch __________ times per day. STRETCH - Soleus, Standing  Place hands on wall.  Extend right / left leg, keeping the other knee somewhat bent.  Slightly point your toes inward on your back foot.  Keep your right / left heel on the floor, bend your back knee, and slightly shift your weight over the back leg so that you feel a gentle stretch deep in your back calf.  Hold this position for __________ seconds. Repeat __________ times. Complete this stretch __________ times per day. STRETCH - Gastrocsoleus, Standing  Note: This exercise can place a lot of stress on your foot  and ankle. Please complete this exercise only if specifically instructed by your caregiver.   Place the ball of your right / left foot on a step, keeping your other foot firmly on the same step.  Hold on to the wall or a rail for balance.  Slowly lift your other foot, allowing your body weight to press your heel down over the edge of the step.  You should feel a stretch in your right / left calf.  Hold this position for __________ seconds.  Repeat this exercise with a slight bend in your right / left knee. Repeat __________ times. Complete this  stretch __________ times per day.  STRENGTHENING EXERCISES - Plantar Fasciitis (Heel Spur Syndrome)  These exercises may help you when beginning to rehabilitate your injury. They may resolve your symptoms with or without further involvement from your physician, physical therapist or athletic trainer. While completing these exercises, remember:   Muscles can gain both the endurance and the strength needed for everyday activities through controlled exercises.  Complete these exercises as instructed by your physician, physical therapist or athletic trainer. Progress the resistance and repetitions only as guided. STRENGTH - Towel Curls  Sit in a chair positioned on a non-carpeted surface.  Place your foot on a towel, keeping your heel on the floor.  Pull the towel toward your heel by only curling your toes. Keep your heel on the floor.  If instructed by your physician, physical therapist or athletic trainer, add ____________________ at the end of the towel. Repeat __________ times. Complete this exercise __________ times per day. STRENGTH - Ankle Inversion  Secure one end of a rubber exercise band/tubing to a fixed object (table, pole). Loop the other end around your foot just before your toes.  Place your fists between your knees. This will focus your strengthening at your ankle.  Slowly, pull your big toe up and in, making sure the band/tubing is positioned to resist the entire motion.  Hold this position for __________ seconds.  Have your muscles resist the band/tubing as it slowly pulls your foot back to the starting position. Repeat __________ times. Complete this exercises __________ times per day.    This information is not intended to replace advice given to you by your health care provider. Make sure you discuss any questions you have with your health care provider.   Document Released: 01/21/2005 Document Revised: 06/07/2014 Document Reviewed: 05/05/2008 [employer]  Patient Education [employer]2016 [employer]

## 2015-06-26 NOTE — Progress Notes (Signed)
Chief Complaint  Patient presents with  . Foot Pain    LEFT FOOT PAIN   HPI   Left heel pain x several weeks.  She wore a cam walker, did cryotherapy and home exercises   Severe  Plantar left heel Sharp Hard to walk  No trauma  Review of Systems  Constitutional: Negative for fever and chills.  Gastrointestinal: Negative for vomiting.  Neurological: Negative for sensory change.    Past Medical History  Diagnosis Date  . Depression     No past surgical history on file. No family history on file. Social History  Substance Use Topics  . Smoking status: Former Games developermoker  . Smokeless tobacco: None  . Alcohol Use: No    Current outpatient prescriptions:  .  FLUoxetine HCl (PROZAC PO), Take by mouth., Disp: , Rfl:  .  Homeopathic Products (SINUS MEDICINE PO), Take by mouth., Disp: , Rfl:  .  LORazepam (ATIVAN) 0.5 MG tablet, 0.5 mg., Disp: , Rfl:  .  MELOXICAM PO, Take by mouth., Disp: , Rfl:  .  naproxen sodium (ANAPROX) 220 MG tablet, Take 220 mg by mouth 2 (two) times daily with a meal., Disp: , Rfl:  .  Norethin-Eth Estradiol-Fe (GENERESS FE PO), Take 1 tablet by mouth daily., Disp: , Rfl:   BP 141/90 mmHg  Ht 5\' 2"  (1.575 m)  Wt 153 lb (69.4 kg)  BMI 27.98 kg/m2  Physical Exam  Constitutional: She is oriented to person, place, and time. She appears well-developed and well-nourished. No distress.  Cardiovascular: Normal rate and intact distal pulses.   Neurological: She is alert and oriented to person, place, and time. She has normal reflexes. She exhibits normal muscle tone. Coordination normal.  Skin: Skin is warm and dry. No rash noted. She is not diaphoretic. No erythema. No pallor.  Psychiatric: She has a normal mood and affect. Her behavior is normal. Judgment and thought content normal.    Right Ankle Exam   Range of Motion  Dorsiflexion: normal   Muscle Strength  Dorsiflexion:  5/5 Plantar flexion:  5/5  Tests  Anterior drawer: negative Other   Erythema: absent Scars: absent Sensation: normal Pulse: present    Left Ankle Exam   Range of Motion  Dorsiflexion: normal   Muscle Strength  Dorsiflexion:  5/5  Plantar flexion:  5/5   Tests  Anterior drawer: negative  Other  Erythema: absent Scars: absent Sensation: normal Pulse: present  Comments:  Left plantar foot tenderness   Back Exam   Other  Gait: antalgic        ASSESSMENT: My personal interpretation of the images:  Her xray I see plantar spur    PLAN Injection   A steroid injection was performed at left plantar heel using 1% plain Lidocaine and 40 mg of depomedrol. This was well tolerated.

## 2015-08-21 ENCOUNTER — Ambulatory Visit: Payer: BC Managed Care – PPO | Admitting: Orthopedic Surgery

## 2015-08-22 ENCOUNTER — Encounter: Payer: Self-pay | Admitting: Orthopedic Surgery

## 2015-10-06 ENCOUNTER — Ambulatory Visit: Payer: BC Managed Care – PPO | Admitting: Podiatry

## 2015-10-13 ENCOUNTER — Encounter: Payer: Self-pay | Admitting: Podiatry

## 2015-10-13 ENCOUNTER — Ambulatory Visit (INDEPENDENT_AMBULATORY_CARE_PROVIDER_SITE_OTHER): Payer: BC Managed Care – PPO

## 2015-10-13 ENCOUNTER — Ambulatory Visit (INDEPENDENT_AMBULATORY_CARE_PROVIDER_SITE_OTHER): Payer: BC Managed Care – PPO | Admitting: Podiatry

## 2015-10-13 VITALS — BP 132/78 | HR 70 | Resp 16 | Ht 62.0 in | Wt 150.0 lb

## 2015-10-13 DIAGNOSIS — M79672 Pain in left foot: Secondary | ICD-10-CM

## 2015-10-13 DIAGNOSIS — M79671 Pain in right foot: Secondary | ICD-10-CM

## 2015-10-13 DIAGNOSIS — M722 Plantar fascial fibromatosis: Secondary | ICD-10-CM | POA: Diagnosis not present

## 2015-10-13 MED ORDER — PREDNISONE 10 MG PO TABS: ORAL_TABLET | ORAL | 0 refills | Status: DC

## 2015-10-13 MED ORDER — TRIAMCINOLONE ACETONIDE 10 MG/ML IJ SUSP
10.0000 mg | Freq: Once | INTRAMUSCULAR | Status: AC
  Administered 2015-10-13: 10 mg

## 2015-10-13 NOTE — Patient Instructions (Signed)

## 2015-10-13 NOTE — Progress Notes (Signed)
   Subjective:    Patient ID: Julie Christensen, female    DOB: 11/24/1965, 50 y.o.   MRN: [id number]  HPI Chief Complaint  Patient presents with  . Foot Pain    bilateral heel with the L being the worst.  Pt reports she takes ibuprofen daily and sleeps in a boot.      Review of Systems  Musculoskeletal: Positive for back pain and gait problem.  All other systems reviewed and are negative.      Objective:   Physical Exam        Assessment & Plan:

## 2015-10-15 NOTE — Progress Notes (Signed)
Subjective:     Patient ID: Julie Christensen, female   DOB: 08/31/1965, 50 y.o.   MRN: [id number]  HPI patient presents with heel pain left over right with inflammation fluid buildup and states it's been present for a long time. She had had previous injections by her family physician and has had minimal response with pain present for around a year   Review of Systems  All other systems reviewed and are negative.      Objective:   Physical Exam  Constitutional: She is oriented to person, place, and time.  Cardiovascular: Intact distal pulses.   Musculoskeletal: Normal range of motion.  Neurological: She is oriented to person, place, and time.  Skin: Skin is warm.  Nursing note and vitals reviewed.  neurovascular status intact muscle strength adequate range of motion within normal limits with patient found to have exquisite discomfort plantar aspect heel left over right with depression of the arch noted and inflammation fluid around the medial band. 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 Today I went ahead and I injected the left plantar fascia 3 Milligan Kenalog 5 mg Xylocaine and dispensed fascial brace for both feet and we will also begin orthotics again after this is done and again have to consider other treatments depending on response  X-ray report indicated large spur formation left over right with no indications of stress fracture

## 2015-10-18 ENCOUNTER — Other Ambulatory Visit: Payer: Self-pay

## 2015-10-18 MED ORDER — DICLOFENAC SODIUM 75 MG PO TBEC: 75.0000 mg | DELAYED_RELEASE_TABLET | Freq: Two times a day (BID) | ORAL | 0 refills | Status: DC

## 2015-10-18 NOTE — Progress Notes (Signed)
Pt called stating that the prednisone prescribed to her on Friday was keeping her awake at night. Per Dr Charlsie Merlesegal, d/c prednisone, Rx Diclofenac 75mg  BID #[id number], call if any acute symptoms arise

## 2015-10-20 ENCOUNTER — Telehealth: Payer: Self-pay

## 2015-10-20 NOTE — Telephone Encounter (Signed)
LVM for pt regarding her c/o abd pain when taking diclofenac, advised her to only take one a day with food, if the stomach pain stops then continue with 1 pill a day, if it continues to d/c medication and we would contact her with alternative instructions

## 2015-10-27 ENCOUNTER — Encounter: Payer: Self-pay | Admitting: Podiatry

## 2015-10-27 ENCOUNTER — Ambulatory Visit (INDEPENDENT_AMBULATORY_CARE_PROVIDER_SITE_OTHER): Payer: BC Managed Care – PPO | Admitting: Podiatry

## 2015-10-27 DIAGNOSIS — M722 Plantar fascial fibromatosis: Secondary | ICD-10-CM

## 2015-10-27 MED ORDER — MELOXICAM 15 MG PO TABS: 15.0000 mg | ORAL_TABLET | Freq: Every day | ORAL | 2 refills | Status: DC

## 2015-10-30 NOTE — Progress Notes (Signed)
Subjective:     Patient ID: Julie Christensen, female   DOB: 11/19/1965, 50 y.o.   MRN: [id number]  HPI patient states the heel is feeling some better but she continues to have discomfort in the morning and after periods of sitting and knows that she's had this problem for a year and is had extensive treatment up to this point   Review of Systems     Objective:   Physical Exam Neurovascular status intact with continued discomfort plantar heel that has improved but is still present especially in the morning and after periods of sitting and with mechanical walking on the foot    Assessment:     Chronic plantar fasciitis left over right with improvement so far of the acute symptoms    Plan:     Discussed that this is a chronic condition and reviewed that fact with patient. At this point I did dispensed night splint with all instructions on usage and I scanned for a Berkley type orthotic to provide for complete plantar support. I reviewed this and she will be seen back when they are ready or earlier if needed

## 2015-11-07 ENCOUNTER — Telehealth: Payer: Self-pay | Admitting: *Deleted

## 2015-11-07 NOTE — Telephone Encounter (Signed)
Pt states the heels were doing good then suddenly became painful again, and pain is worse in the night splint, and has begun to have swelling in the face and hands.  I directed pt to stop the Meloxicam, which may be the cause of the swelling, begin aggressive ice therapy,using a fabric covered ice pack 3-4 times daily to painful area 20 minutes each session, and transferred pt to schedulers for earlier appt and possibly to discuss topical antiinflammatory medication.

## 2015-11-08 ENCOUNTER — Ambulatory Visit (INDEPENDENT_AMBULATORY_CARE_PROVIDER_SITE_OTHER): Payer: BC Managed Care – PPO | Admitting: Podiatry

## 2015-11-08 ENCOUNTER — Encounter: Payer: Self-pay | Admitting: Podiatry

## 2015-11-08 DIAGNOSIS — M722 Plantar fascial fibromatosis: Secondary | ICD-10-CM

## 2015-11-08 MED ORDER — TRIAMCINOLONE ACETONIDE 10 MG/ML IJ SUSP
10.0000 mg | Freq: Once | INTRAMUSCULAR | Status: AC
  Administered 2015-11-08: 10 mg

## 2015-11-08 NOTE — Patient Instructions (Signed)

## 2015-11-08 NOTE — Progress Notes (Signed)
Subjective:     Patient ID: Julie Christensen, female   DOB: 10/04/1965, 50 y.o.   MRN: [id number]  HPI patient states that both of my heels are bothering me quite a bit with the left being worse and that it has gradually gotten worse in the night splint seems to bother me   Review of Systems     Objective:   Physical Exam Neurovascular status intact muscle strength adequate with patient found to have inflammation and pain plantar aspect heel region left over right with fluid buildup around the medial band    Assessment:     Continued plantar fasciitis bilateral despite conservative treatments    Plan:     Orthotics were dispensed and today I injected the fascia bilateral at the insertion 3 mg Kenalog 5 mg Xylocaine and advised on the importance of night splint usage. Reappoint for us to recheck

## 2015-11-17 ENCOUNTER — Encounter: Payer: BC Managed Care – PPO | Admitting: Podiatry

## 2015-12-20 ENCOUNTER — Ambulatory Visit: Payer: BC Managed Care – PPO | Admitting: Podiatry

## 2016-01-15 ENCOUNTER — Ambulatory Visit: Payer: BC Managed Care – PPO | Admitting: Podiatry

## 2016-07-19 DIAGNOSIS — M7062 Trochanteric bursitis, left hip: Secondary | ICD-10-CM

## 2016-07-19 DIAGNOSIS — Z8639 Personal history of other endocrine, nutritional and metabolic disease: Secondary | ICD-10-CM | POA: Insufficient documentation

## 2016-07-19 DIAGNOSIS — M2550 Pain in unspecified joint: Secondary | ICD-10-CM | POA: Insufficient documentation

## 2016-07-19 DIAGNOSIS — R5383 Other fatigue: Secondary | ICD-10-CM | POA: Insufficient documentation

## 2016-07-19 DIAGNOSIS — M7061 Trochanteric bursitis, right hip: Secondary | ICD-10-CM | POA: Insufficient documentation

## 2016-07-19 DIAGNOSIS — Z8719 Personal history of other diseases of the digestive system: Secondary | ICD-10-CM | POA: Insufficient documentation
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
## 2016-07-23 ENCOUNTER — Ambulatory Visit (INDEPENDENT_AMBULATORY_CARE_PROVIDER_SITE_OTHER): Payer: Self-pay

## 2016-07-23 ENCOUNTER — Encounter: Payer: Self-pay | Admitting: Rheumatology

## 2016-07-23 ENCOUNTER — Ambulatory Visit (INDEPENDENT_AMBULATORY_CARE_PROVIDER_SITE_OTHER): Payer: BC Managed Care – PPO | Admitting: Rheumatology

## 2016-07-23 ENCOUNTER — Ambulatory Visit (INDEPENDENT_AMBULATORY_CARE_PROVIDER_SITE_OTHER): Payer: BC Managed Care – PPO

## 2016-07-23 VITALS — BP 132/84 | HR 82 | Resp 14 | Ht 62.0 in | Wt 158.0 lb

## 2016-07-23 DIAGNOSIS — M2550 Pain in unspecified joint: Secondary | ICD-10-CM

## 2016-07-23 DIAGNOSIS — M79672 Pain in left foot: Secondary | ICD-10-CM | POA: Diagnosis not present

## 2016-07-23 DIAGNOSIS — M79642 Pain in left hand: Secondary | ICD-10-CM | POA: Diagnosis not present

## 2016-07-23 DIAGNOSIS — M7062 Trochanteric bursitis, left hip: Secondary | ICD-10-CM

## 2016-07-23 DIAGNOSIS — Z8639 Personal history of other endocrine, nutritional and metabolic disease: Secondary | ICD-10-CM

## 2016-07-23 DIAGNOSIS — Z8719 Personal history of other diseases of the digestive system: Secondary | ICD-10-CM

## 2016-07-23 DIAGNOSIS — M7061 Trochanteric bursitis, right hip: Secondary | ICD-10-CM

## 2016-07-23 DIAGNOSIS — R5383 Other fatigue: Secondary | ICD-10-CM

## 2016-07-23 DIAGNOSIS — M79641 Pain in right hand: Secondary | ICD-10-CM | POA: Diagnosis not present

## 2016-07-23 DIAGNOSIS — M79671 Pain in right foot: Secondary | ICD-10-CM

## 2016-07-23 LAB — TSH: TSH: 3.08 mIU/L

## 2016-07-23 NOTE — Patient Instructions (Signed)
Supplements for OA . Turmeric (capsules) . Ginger (root or capsules) . Tart Cherry (dried or extract) . Omega 3 (fish, flax seeds, chia seeds, walnuts , almonds)  Patient should be under the care of physician while taking these supplements      Hand Exercises Hand exercises can be helpful to almost anyone. These exercises can strengthen the hands, improve flexibility and movement, and increase blood flow to the hands. These results can make work and daily tasks easier. Hand exercises can be especially helpful for people who have joint pain from arthritis or have nerve damage from overuse (carpal tunnel syndrome). These exercises can also help people who have injured a hand. Most of these hand exercises are fairly gentle stretching routines. You can do them often throughout the day. Still, it is a good idea to ask your health care provider which exercises would be best for you. Warming your hands before exercise may help to reduce stiffness. You can do this with gentle massage or by placing your hands in warm water for 15 minutes. Also, make sure you pay attention to your level of hand pain as you begin an exercise routine. Exercises Knuckle Bend Repeat this exercise 5-10 times with each hand. 1. Stand or sit with your arm, hand, and all five fingers pointed straight up. Make sure your wrist is straight. 2. Gently and slowly bend your fingers down and inward until the tips of your fingers are touching the tops of your palm. 3. Hold this position for a few seconds. 4. Extend your fingers out to their original position, all pointing straight up again.  Finger Fan Repeat this exercise 5-10 times with each hand. 1. Hold your arm and hand out in front of you. Keep your wrist straight. 2. Squeeze your hand into a fist. 3. Hold this position for a few seconds. 4. Loel DubonnetFan out, or spread apart, your hand and fingers as much as possible, stretching every joint fully.  Tabletop Repeat this exercise  5-10 times with each hand. 1. Stand or sit with your arm, hand, and all five fingers pointed straight up. Make sure your wrist is straight. 2. Gently and slowly bend your fingers at the knuckles where they meet the hand until your hand is making an upside-down L shape. Your fingers should form a tabletop. 3. Hold this position for a few seconds. 4. Extend your fingers out to their original position, all pointing straight up again.  Making Os Repeat this exercise 5-10 times with each hand. 1. Stand or sit with your arm, hand, and all five fingers pointed straight up. Make sure your wrist is straight. 2. Make an O shape by touching your pointer finger to your thumb. Hold for a few seconds. Then open your hand wide. 3. Repeat this motion with each finger on your hand.  Table Spread Repeat this exercise 5-10 times with each hand. 1. Place your hand on a table with your palm facing down. Make sure your wrist is straight. 2. Spread your fingers out as much as possible. Hold this position for a few seconds. 3. Slide your fingers back together again. Hold for a few seconds.  Ball Grip  Repeat this exercise 10-15 times with each hand. 1. Hold a tennis ball or another soft ball in your hand. 2. While slowly increasing pressure, squeeze the ball as hard as possible. 3. Squeeze as hard as you can for 3-5 seconds. 4. Relax and repeat.  Wrist Curls Repeat this exercise 10-15 times with  each hand. 1. Sit in a chair that has armrests. 2. Hold a light weight in your hand, such as a dumbbell that weighs 1-3 pounds (0.5-1.4 kg). Ask your health care provider what weight would be best for you. 3. Rest your hand just over the end of the chair arm with your palm facing up. 4. Gently pivot your wrist up and down while holding the weight. Do not twist your wrist from side to side.  Contact a health care provider if:  Your hand pain or discomfort gets much worse when you do an exercise.  Your hand pain  or discomfort does not improve within 2 hours after you exercise. If you have any of these problems, stop doing these exercises right away. Do not do them again unless your health care provider says that you can. Get help right away if:  You develop sudden, severe hand pain. If this happens, stop doing these exercises right away. Do not do them again unless your health care provider says that you can. This information is not intended to replace advice given to you by your health care provider. Make sure you discuss any questions you have with your health care provider. Document Released: 01/02/2015 Document Revised: 06/29/2015 Document Reviewed: 08/01/2014 Elsevier Interactive Patient Education  2018 [employer] Iliotibial Band Syndrome Iliotibial band syndrome (ITBS) is a condition that often causes knee pain. It can also cause pain in the outside of your hip, thigh, and knee. The iliotibial band is a strip of tissue that runs from the outside of your hip and down your thigh to the outside of your knee. Repeatedly bending and straightening your knee can irritate the iliotibial band. What are the causes? This condition is caused by inflammation and irritation from the friction of the iliotibial band moving over the thigh bone (femur) when you repeatedly bend and straighten your knee. What increases the risk? This condition is more likely to develop in people who:  Frequently change elevation during their workouts.  Run very long distances.  Recently increased the length or intensity of their workouts.  Run downhill often, or just started running downhill.  Ride a bike very far or often.  You may also be at greater risk if you start a new workout routine without first warming up or if you have a job that requires you to bend, squat, or climb frequently. What are the signs or symptoms? Symptoms of this condition include:  Pain along the outside of your knee that may be worse with  activity, especially running or going up and down stairs.  A "snapping" sensation over your knee.  Swelling on the outside of your knee.  Pain or a feeling of tightness in your hip.  How is this diagnosed? This condition is diagnosed based on your symptoms, medical history, and physical exam. You may also see a health care provider who specializes in reducing pain and increasing mobility (physical therapist). A physical therapist may do an exam to check your balance, movement, and way of walking or running (gait) to see whether the way you move could contribute to your injury. You may also have tests to measure your strength, flexibility, and range of motion. How is this treated? Treatment for this condition includes:  Resting and limiting exercise.  Returning to activities gradually.  Doing range-of-motion and strengthening exercises (physical therapy) as told by your health care provider.  Including low-impact activities, such as swimming, in your exercise routine.  Follow these instructions at home:  If directed, apply ice to the injured area. ? Put ice in a plastic bag. ? Place a towel between your skin and the bag. ? Leave the ice on for 20 minutes, 2-3 times per day.  Return to your normal activities as told by your health care provider. Ask your health care provider what activities are safe for you.  Keep all follow-up visits with your health care provider. This is important. Contact a health care provider if:  Your pain does not improve or gets worse despite treatment. This information is not intended to replace advice given to you by your health care provider. Make sure you discuss any questions you have with your health care provider. Document Released: 07/13/2001 Document Revised: 02/23/2016 Document Reviewed: 02/23/2016 Elsevier Interactive Patient Education  [employer].

## 2016-07-24 LAB — URINALYSIS, ROUTINE W REFLEX MICROSCOPIC
BILIRUBIN URINE: NEGATIVE
Glucose, UA: NEGATIVE
Hgb urine dipstick: NEGATIVE
Ketones, ur: NEGATIVE
Leukocytes, UA: NEGATIVE
NITRITE: NEGATIVE
PH: 5.5 (ref 5.0–8.0)
SPECIFIC GRAVITY, URINE: 1.03 (ref 1.001–1.035)

## 2016-07-24 LAB — SEDIMENTATION RATE: SED RATE: 1 mm/h (ref 0–20)

## 2016-07-24 LAB — CK: CK TOTAL: 80 U/L (ref 29–143)

## 2016-07-24 LAB — PAN-ANCA
ANCA Screen: NEGATIVE
Myeloperoxidase Abs: <1
Serine Protease 3: <1

## 2016-07-24 LAB — VITAMIN D 25 HYDROXY (VIT D DEFICIENCY, FRACTURES): VIT D 25 HYDROXY: 28 ng/mL — AB (ref 30–100)

## 2016-07-24 LAB — CYCLIC CITRUL PEPTIDE ANTIBODY, IGG

## 2016-07-26 ENCOUNTER — Telehealth: Payer: Self-pay

## 2016-07-26 NOTE — Telephone Encounter (Signed)
LMAM for patient to call back.

## 2016-07-26 NOTE — Telephone Encounter (Signed)
Patient states she started feeling pain in her arm this morning when she went to stretch. Patient states it was a tingling last night. Patient states the pain only started this morning. She states it feels like some one is shocking her. Patient states she the lab tech made sure she was okay before leaving to office on Tuesday. Patient states she has not tried anything to relieve discomfort.

## 2016-07-26 NOTE — Telephone Encounter (Signed)
Please advise. Thank you
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
## 2016-07-26 NOTE — Telephone Encounter (Addendum)
Patient returned call today. She stated that  she had a blood draw 3 days ago. She  experienced pain at the time of blood draw. The person who drew her blood told her that she accidentally hit her nerve. Patient states that last night she started experiencing pain in her right arm which was very similar to the pain she experienced at the time of blood draw. The pain is worse today. She denies any fullness at the injection site. She denies any numbness in her hand. She's been experiencing some "electricity-like" pain in her right arm. She stated that  after she called the f office and notified about her symptoms, she received a phone call from the lab tech who told the patient that the information was not correct. The lab tech told her that she never discussed with the patient  that  she hit  her nerve at the time of blood draw. Patient stated that she did not appreciate a phone call from the lab technician and also her lying about. I told her that I will look further into this matter. I've advised her to watch her symptoms for right now if they are not better then she should call us back on Monday. If she has worsening of her symptoms over the weekend then she should call me.

## 2016-07-27 LAB — 14-3-3 ETA PROTEIN

## 2016-07-29 LAB — HLA-B27 ANTIGEN: DNA RESULT: NEGATIVE

## 2016-07-29 NOTE — Progress Notes (Signed)
Vitamin D is low.She should take 2000 units over-the-counter daily

## 2016-07-30 NOTE — Telephone Encounter (Signed)
I called patient to advise. She states her hair is coming out in large patches now. She has asked what this could be from, can we discuss?  She reports her arm is better, I advised warm compress if still painful. She understands.

## 2016-07-30 NOTE — Telephone Encounter (Signed)
Besides vitamin D deficiency she does not have any abnormal labs. I do not see any evidence of autoimmune disease. I would recommend for her to see a dermatologist for patchy hair loss.

## 2016-07-30 NOTE — Telephone Encounter (Signed)
Thank you  I have called patient to advise.  

## 2016-07-30 NOTE — Telephone Encounter (Signed)
-----   Message from Pollyann SavoyShaili Deveshwar, MD sent at 07/29/2016  3:48 PM EDT ----- Vitamin D is low.She should take 2000 units over-the-counter daily

## 2016-08-06 ENCOUNTER — Telehealth: Payer: Self-pay | Admitting: Rheumatology

## 2016-08-06 DIAGNOSIS — E559 Vitamin D deficiency, unspecified: Secondary | ICD-10-CM

## 2016-08-06 NOTE — Telephone Encounter (Signed)
Patient called wanting to know how low her Vitamin D was because her hair is now falling out, so she wants to know if this could be related.  Thank you.

## 2016-08-06 NOTE — Telephone Encounter (Signed)
Patient is concerned because she is losing her hair.Patient states is is coming out in chunks and breaking off. Patient did have a low vitamin D level at 28 and wondered if that could be related. Patient states she did have her hair colored on 07/25/16 and has went back to the person who colored her hair who says they did not use anything different In her than before. Patient is currently on Phentermine, Flonase, Vit D3, IBU 600 mg, Align, birth control pills and hair skin and nail vitamins. Please advise.

## 2016-08-06 NOTE — Telephone Encounter (Signed)
Okay to call and vitamin D 50,000 units once a week for 90 days. Check level some 3 months. She should see a dermatologist for evaluation of hair loss

## 2016-08-08 MED ORDER — VITAMIN D (ERGOCALCIFEROL) 1.25 MG (50000 UNIT) PO CAPS: 50000.0000 [IU] | ORAL_CAPSULE | ORAL | 0 refills | Status: DC

## 2016-08-08 NOTE — Telephone Encounter (Signed)
Patient advised of recommendations. Prescription for Vitamins D sent to the pharmacy and patient will have Vitamin D rechecked in 3 months. Patient has seen a dermatologist in the past and will call to schedule an appointment. If she has any trouble will call back for a referral.

## 2016-08-08 NOTE — Addendum Note (Signed)
Addended by: Henriette CombsHATTON, Savahanna Almendariz L on: 08/08/2016 11:17 AM   Modules accepted: Orders

## 2016-08-20 DIAGNOSIS — F17200 Nicotine dependence, unspecified, uncomplicated: Secondary | ICD-10-CM | POA: Insufficient documentation

## 2016-08-20 DIAGNOSIS — M19041 Primary osteoarthritis, right hand: Secondary | ICD-10-CM | POA: Insufficient documentation

## 2016-08-20 DIAGNOSIS — M19042 Primary osteoarthritis, left hand: Secondary | ICD-10-CM
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
## 2016-08-22 ENCOUNTER — Ambulatory Visit: Payer: BC Managed Care – PPO | Admitting: Rheumatology

## 2016-09-05 ENCOUNTER — Ambulatory Visit: Payer: BC Managed Care – PPO | Admitting: General Surgery

## 2016-09-09 ENCOUNTER — Other Ambulatory Visit: Payer: Self-pay | Admitting: Surgery

## 2016-09-09 ENCOUNTER — Ambulatory Visit: Payer: Self-pay | Admitting: Surgery

## 2016-09-09 DIAGNOSIS — R221 Localized swelling, mass and lump, neck: Secondary | ICD-10-CM

## 2016-09-09 NOTE — H&P (Signed)
History of Present Illness Julie Christensen. Jodi Criscuolo MD; 09/09/2016 10:12 AM) The patient is a 52 year old female who presents with a complaint of Mass. Referred by Dr. Elfredia Nevins for evaluation of right neck Mass.  This is a healthy 51 year old female who has a two-month history of a visible mass on the anterolateral right side of her neck near the base. One of her friends first noticed this. Since she first started paying attention to this area she feels that it has become slightly larger. There is no pain associated with this area. However she does notice that her right arm "falls asleep" more than it used to. She does not have any pain radiating down her arm. There has not been any imaging of this area.   Past Surgical History (Tanisha A. Manson Passey, RMA; 09/09/2016 9:27 AM) Gallbladder Surgery - Laparoscopic  Diagnostic Studies History (Tanisha A. Manson Passey, RMA; 09/09/2016 9:27 AM) Colonoscopy >10 years ago Mammogram within last year Pap Smear 1-5 years ago  Allergies (Tanisha A. Manson Passey, RMA; 09/09/2016 9:29 AM) Sulfa Antibiotics Wellbutrin *ANTIDEPRESSANTS* Allergies Reconciled  Medication History (Tanisha A. Manson Passey, RMA; 09/09/2016 9:30 AM) Vitamin D (Ergocalciferol) (50000UNIT Capsule, Oral) Active. Phentermine HCl (37.5MG  Capsule, Oral) Active. Ibuprofen (600MG  Tablet, Oral) Active. Norethindrone-Eth Estradiol (1-5MG -MCG Tablet, Oral) Active. Medications Reconciled  Social History (Tanisha A. Manson Passey, RMA; 09/09/2016 9:27 AM) Alcohol use Occasional alcohol use. Caffeine use Carbonated beverages, Coffee, Tea. Illicit drug use Remotely quit drug use. Tobacco use Current some day smoker.  Family History (Tanisha A. Manson Passey, RMA; 09/09/2016 9:27 AM) Alcohol Abuse Father. Arthritis Mother. Hypertension Brother, Mother. Thyroid problems Father.  Pregnancy / Birth History (Tanisha A. Manson Passey, RMA; 09/09/2016 9:27 AM) Age at menarche 14 years. Contraceptive History Oral  contraceptives. Gravida 2 Maternal age 41-25 Para 2  Other Problems (Tanisha A. Manson Passey, RMA; 09/09/2016 9:27 AM) Back Pain     Review of Systems (Tanisha A. Brown RMA; 09/09/2016 9:27 AM) General Present- Fatigue and Night Sweats. Not Present- Appetite Loss, Chills, Fever, Weight Gain and Weight Loss. Skin Present- Dryness and Rash. Not Present- Change in Wart/Mole, Hives, Jaundice, New Lesions, Non-Healing Wounds and Ulcer. HEENT Present- Ringing in the Ears and Wears glasses/contact lenses. Not Present- Earache, Hearing Loss, Hoarseness, Nose Bleed, Oral Ulcers, Seasonal Allergies, Sinus Pain, Sore Throat, Visual Disturbances and Yellow Eyes. Respiratory Not Present- Bloody sputum, Chronic Cough, Difficulty Breathing, Snoring and Wheezing. Breast Not Present- Breast Mass, Breast Pain, Nipple Discharge and Skin Changes. Cardiovascular Present- Swelling of Extremities. Not Present- Chest Pain, Difficulty Breathing Lying Down, Leg Cramps, Palpitations, Rapid Heart Rate and Shortness of Breath. Gastrointestinal Present- Bloating and Constipation. Not Present- Abdominal Pain, Bloody Stool, Change in Bowel Habits, Chronic diarrhea, Difficulty Swallowing, Excessive gas, Gets full quickly at meals, Hemorrhoids, Indigestion, Nausea, Rectal Pain and Vomiting. Female Genitourinary Not Present- Frequency, Nocturia, Painful Urination, Pelvic Pain and Urgency. Musculoskeletal Present- Back Pain, Joint Pain, Joint Stiffness, Muscle Pain, Muscle Weakness and Swelling of Extremities. Neurological Present- Decreased Memory, Tingling, Trouble walking and Weakness. Not Present- Fainting, Headaches, Numbness, Seizures and Tremor. Psychiatric Present- Change in Sleep Pattern. Not Present- Anxiety, Bipolar, Depression, Fearful and Frequent crying. Endocrine Present- Hair Changes and Hot flashes. Not Present- Cold Intolerance, Excessive Hunger, Heat Intolerance and New Diabetes.  Vitals (Tanisha A. Brown RMA;  09/09/2016 9:28 AM) 09/09/2016 9:28 AM Weight: 149.2 lb Height: 62in Body Surface Area: 1.69 m Body Mass Index: 27.29 kg/m  Temp.: 97.78F  Pulse: 99 (Regular)  BP: 132/80 (Sitting, Left Arm, Standard)  Physical Exam Molli Hazard(Jeoffrey Eleazer K. Nguyet Mercer MD; 09/09/2016 10:13 AM)  The physical exam findings are as follows: Note:WDWN in NAD Eyes: Pupils equal, round; sclera anicteric HENT: Oral mucosa moist; good dentition Neck: Right anterolateral neck just lateral to SCM - 3 cm palpable subcutaneous mass, soft, well-demarcated; no thyromegaly Lungs: CTA bilaterally; normal respiratory effort CV: Regular rate and rhythm; no murmurs; extremities well-perfused with no edema Abd: +bowel sounds, soft, non-tender, no palpable organomegaly; no palpable hernias Skin: Warm, dry; no sign of jaundice Psychiatric - alert and oriented x 4; calm mood and affect    Assessment & Plan Molli Hazard(Tkai Serfass K. 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  After ultrasound complete, will probably proceed with scheduling excision of subcutaneous lipoma, right neck. The surgical procedure has been discussed with the patient. Potential risks, benefits, alternative treatments, and expected outcomes have been explained. All of the patient's questions at this time have been answered. The likelihood of reaching the patient's treatment goal is good. The patient understand the proposed surgical procedure and wishes to proceed.  Julie Christensen K. Corliss Skainssuei, MD, [facility]  General/ Trauma Surgery  09/09/2016 10:14 AM

## 2016-09-18 ENCOUNTER — Encounter: Payer: Self-pay | Admitting: Podiatry

## 2016-09-18 ENCOUNTER — Ambulatory Visit (INDEPENDENT_AMBULATORY_CARE_PROVIDER_SITE_OTHER): Payer: BC Managed Care – PPO | Admitting: Podiatry

## 2016-09-18 DIAGNOSIS — M722 Plantar fascial fibromatosis: Secondary | ICD-10-CM | POA: Diagnosis not present

## 2016-09-18 MED ORDER — TRIAMCINOLONE ACETONIDE 10 MG/ML IJ SUSP
10.0000 mg | Freq: Once | INTRAMUSCULAR | Status: AC
  Administered 2016-09-18: 10 mg

## 2016-09-19 ENCOUNTER — Ambulatory Visit (HOSPITAL_COMMUNITY): Payer: BC Managed Care – PPO

## 2016-09-19 NOTE — Progress Notes (Signed)
Subjective:    Patient ID: Julie Christensen, female   DOB: 51 y.o.   MRN: [id number]   HPI patient admits she's had chronic pain in her heels and he gets several months of relief last injection but it's come back to quite a significant degree. States that it's worse in the morning and after periods of sitting    ROS      Objective:  Physical Exam neurovascular status intact with fasciitis-like symptoms bilateral at the insertional point tendon calcaneus with moderate change in the arch height     Assessment:    Acute plantar fasciitis bilateral with inability to stretch the feet properly     Plan:    H&P condition reviewed and at this point I injected the plantar fascia bilateral 3 mg Kenalog 5 mg Xylocaine and I then went ahead and dispensed a night splint with all instructions on usage along with ice packs Ace wraps. Patient will be seen back in approximate 8 days to reevaluate before leaving for a trip

## 2016-09-20 ENCOUNTER — Ambulatory Visit (HOSPITAL_COMMUNITY)
Admission: RE | Admit: 2016-09-20 | Discharge: 2016-09-20 | Disposition: A | Discharge status: Home / Self Care | Payer: BC Managed Care – PPO | Source: Ambulatory Visit | Attending: Surgery | Admitting: Surgery

## 2016-09-20 ENCOUNTER — Other Ambulatory Visit: Payer: Self-pay | Admitting: Surgery

## 2016-09-20 DIAGNOSIS — R221 Localized swelling, mass and lump, neck: Secondary | ICD-10-CM | POA: Insufficient documentation

## 2016-09-27 ENCOUNTER — Ambulatory Visit: Payer: BC Managed Care – PPO | Admitting: Podiatry

## 2016-10-01 ENCOUNTER — Other Ambulatory Visit: Payer: BC Managed Care – PPO | Admitting: Orthotics

## 2016-10-16 ENCOUNTER — Other Ambulatory Visit: Payer: BC Managed Care – PPO | Admitting: Rheumatology

## 2017-02-06 ENCOUNTER — Other Ambulatory Visit: Payer: Self-pay | Admitting: Rheumatology

## 2017-02-06 NOTE — Telephone Encounter (Signed)
Patient advised she would need to have her Vitamin D rechecked before refill could be sent in. Patient states she is going to make an appointment to see her PCP.

## 2017-03-09 ENCOUNTER — Other Ambulatory Visit: Payer: Self-pay

## 2017-03-09 ENCOUNTER — Encounter (HOSPITAL_COMMUNITY): Payer: Self-pay | Admitting: Emergency Medicine

## 2017-03-09 ENCOUNTER — Emergency Department (HOSPITAL_COMMUNITY)
Admission: EM | Admit: 2017-03-09 | Discharge: 2017-03-09 | Disposition: A | Discharge status: Home / Self Care | Payer: BC Managed Care – PPO | Attending: Emergency Medicine | Admitting: Emergency Medicine

## 2017-03-09 DIAGNOSIS — Z79899 Other long term (current) drug therapy: Secondary | ICD-10-CM | POA: Insufficient documentation

## 2017-03-09 DIAGNOSIS — M25551 Pain in right hip: Secondary | ICD-10-CM | POA: Diagnosis present

## 2017-03-09 DIAGNOSIS — F17200 Nicotine dependence, unspecified, uncomplicated: Secondary | ICD-10-CM | POA: Insufficient documentation

## 2017-03-09 MED ORDER — ACETAMINOPHEN 500 MG PO TABS: 1000.0000 mg | ORAL_TABLET | Freq: Three times a day (TID) | ORAL | 0 refills | Status: DC

## 2017-03-09 MED ORDER — CYCLOBENZAPRINE HCL 5 MG PO TABS: 5.0000 mg | ORAL_TABLET | Freq: Every evening | ORAL | 0 refills | Status: DC | PRN

## 2017-03-09 MED ORDER — PREDNISONE 10 MG PO TABS: 40.0000 mg | ORAL_TABLET | Freq: Every day | ORAL | 0 refills | Status: AC

## 2017-03-09 NOTE — ED Triage Notes (Signed)
PT c/o right hip pain with hx of bursitis and states started having pain with am with no new injury. PT ambulatory in triage.
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
## 2017-03-09 NOTE — Discharge Instructions (Signed)
Take prednisone as prescribed.  Do not take anti-inflammatories such as Advil, ibuprofen, or Motrin while taking this medicine. Take Tylenol for pain. Use muscle relaxers as needed for pain or stiffness.  Use this at night, as this may make you tired or groggy.  Do not drive or operate heavy machinery while taking this medicine. Follow-up with the orthopedic doctor for further evaluation and management as needed. Return to the emergency room if you develop fevers, numbness, loss of bowel or bladder control, or any new or worsening symptoms.

## 2018-07-23 ENCOUNTER — Encounter: Payer: Self-pay | Admitting: Internal Medicine

## 2018-07-31 ENCOUNTER — Other Ambulatory Visit: Payer: Self-pay

## 2018-08-03 ENCOUNTER — Encounter: Payer: Self-pay | Admitting: Internal Medicine

## 2018-08-03 ENCOUNTER — Other Ambulatory Visit: Payer: Self-pay

## 2018-08-03 ENCOUNTER — Ambulatory Visit: Payer: BC Managed Care – PPO | Admitting: Internal Medicine

## 2018-08-03 VITALS — BP 144/88 | HR 99 | Temp 98.5°F | Ht 63.0 in | Wt 139.4 lb

## 2018-08-03 DIAGNOSIS — E270 Other adrenocortical overactivity: Secondary | ICD-10-CM

## 2018-08-03 DIAGNOSIS — R7989 Other specified abnormal findings of blood chemistry: Secondary | ICD-10-CM
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
## 2018-08-03 NOTE — Patient Instructions (Signed)
SALIVARY CORTISOL COLLECTION INSTRUCTIONS    Precautions:  1. Please collect sample at 11:30 pm. You will need to do this on 2 nights  2. No food or fluids 30 minutes prior to collection.  3. Do not use any creams, lotions on hands, or use steroid inhalers 24- hours prior to collection.  4. Wash hands carefully.  5. Avoid any activity that could cause your gums to bleed: including flushing of brushing your teeth.  Instructions for saliva collection:   1. Rinse mouth thoroughly with water and discard. Do not swallow.  2. Hold the Salivette at the rim of the suspended insert and remove the stopper.  3. Remove the swab.  4. Place swab under tongue until well saturated, approximately 1 minute.   5. Return the saturated swab to the suspended insert and close the Salivette firmly with the stopper.  6. Do not remove the tube holding the insert. The Salivette should be sent to the lab with the swab.   7. Come to the lab and leave the Salivette kit for labeling with your identifying information.   8. Make sure you refrigerate sample if not bringing to the lab immediately. Try to use cold packs for transportation if available.

## 2018-08-04 ENCOUNTER — Encounter: Payer: Self-pay | Admitting: Internal Medicine

## 2018-08-04 LAB — BASIC METABOLIC PANEL
BUN: 12 mg/dL (ref 6–23)
CO2: 25 mEq/L (ref 19–32)
Calcium: 9.2 mg/dL (ref 8.4–10.5)
Chloride: 106 mEq/L (ref 96–112)
Creatinine, Ser: 0.96 mg/dL (ref 0.40–1.20)
GFR: 61.36 mL/min (ref >60.00)
Glucose, Bld: 92 mg/dL (ref 70–99)
Potassium: 4.6 mEq/L (ref 3.5–5.1)
Sodium: 139 mEq/L (ref 135–145)

## 2018-08-04 NOTE — Progress Notes (Signed)
letter

## 2018-08-22 ENCOUNTER — Encounter (HOSPITAL_COMMUNITY): Payer: Self-pay | Admitting: Emergency Medicine

## 2018-08-22 ENCOUNTER — Emergency Department (HOSPITAL_COMMUNITY)
Admission: EM | Admit: 2018-08-22 | Discharge: 2018-08-22 | Disposition: A | Discharge status: Home / Self Care | Payer: BC Managed Care – PPO | Attending: Emergency Medicine | Admitting: Emergency Medicine

## 2018-08-22 ENCOUNTER — Other Ambulatory Visit: Payer: Self-pay

## 2018-08-22 DIAGNOSIS — R300 Dysuria: Secondary | ICD-10-CM | POA: Diagnosis present

## 2018-08-22 DIAGNOSIS — N390 Urinary tract infection, site not specified: Secondary | ICD-10-CM | POA: Insufficient documentation

## 2018-08-22 DIAGNOSIS — Z20828 Contact with and (suspected) exposure to other viral communicable diseases: Secondary | ICD-10-CM | POA: Diagnosis not present

## 2018-08-22 DIAGNOSIS — Z87891 Personal history of nicotine dependence: Secondary | ICD-10-CM | POA: Insufficient documentation

## 2018-08-22 DIAGNOSIS — I10 Essential (primary) hypertension: Secondary | ICD-10-CM | POA: Insufficient documentation

## 2018-08-22 DIAGNOSIS — Z79899 Other long term (current) drug therapy: Secondary | ICD-10-CM | POA: Insufficient documentation

## 2018-08-22 LAB — URINALYSIS, ROUTINE W REFLEX MICROSCOPIC
Bilirubin Urine: NEGATIVE
Glucose, UA: NEGATIVE mg/dL
Ketones, ur: 5 mg/dL — AB
Nitrite: POSITIVE — AB
Protein, ur: 100 mg/dL — AB
RBC / HPF: >50 RBC/hpf — ABNORMAL HIGH (ref 0–5)
Specific Gravity, Urine: 1.021 (ref 1.005–1.030)
WBC, UA: >50 WBC/hpf — ABNORMAL HIGH (ref 0–5)
pH: 5 (ref 5.0–8.0)

## 2018-08-22 MED ORDER — FLUCONAZOLE 100 MG PO TABS
200.0000 mg | ORAL_TABLET | Freq: Once | ORAL | Status: AC
  Administered 2018-08-22: 200 mg via ORAL
  Filled 2018-08-22: qty 2

## 2018-08-22 MED ORDER — TRAMADOL HCL 50 MG PO TABS: 50.0000 mg | ORAL_TABLET | Freq: Four times a day (QID) | ORAL | 0 refills | Status: DC | PRN

## 2018-08-22 MED ORDER — CEFDINIR 300 MG PO CAPS: 300.0000 mg | ORAL_CAPSULE | Freq: Two times a day (BID) | ORAL | 0 refills | Status: DC

## 2018-08-22 MED ORDER — ONDANSETRON HCL 4 MG PO TABS: 4.0000 mg | ORAL_TABLET | Freq: Four times a day (QID) | ORAL | 0 refills | Status: DC

## 2018-08-22 MED ORDER — KETOROLAC TROMETHAMINE 30 MG/ML IJ SOLN
30.0000 mg | Freq: Once | INTRAMUSCULAR | Status: AC
  Administered 2018-08-22: 30 mg via INTRAMUSCULAR
  Filled 2018-08-22: qty 1

## 2018-08-22 MED ORDER — ONDANSETRON HCL 4 MG PO TABS
4.0000 mg | ORAL_TABLET | Freq: Once | ORAL | Status: AC
  Administered 2018-08-22: 4 mg via ORAL
  Filled 2018-08-22: qty 1

## 2018-08-22 MED ORDER — CEFIXIME 400 MG PO CAPS
400.0000 mg | ORAL_CAPSULE | Freq: Once | ORAL | Status: AC
  Administered 2018-08-22: 400 mg via ORAL
  Filled 2018-08-22: qty 1
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
## 2018-08-22 NOTE — Discharge Instructions (Addendum)
Your urine test suggest a urinary tract infection.  A culture has been sent to the lab.  Please increase fluids.  Please use Omnicef 2 times daily with food.  Use Tylenol every 4 hours or ibuprofen every 6 hours for mild pain.  Use Zofran for nausea. Use Ultram for more severe pain. This medication may cause drowsiness. Please do not drink, drive, or participate in activity that requires concentration while taking this medication.  Please see your primary physician in 7 to 10 days for recheck of your urine to ensure that this infection has resolved.  A COVID-19 virus test has been obtained.  Please quarantine yourself, use your mask daily, wash hands frequently, and maintain physical distancing until you receive the results of this test.

## 2018-08-22 NOTE — ED Triage Notes (Signed)
Pt [place] the beach [place] the [place]. Has been having cloudy, foul urine with right flank pain for several days. Taking Azo and ibuprofen without relief.

## 2018-08-24 LAB — NOVEL CORONAVIRUS, NAA (HOSP ORDER, SEND-OUT TO REF LAB; TAT 18-24 HRS): SARS-CoV-2, NAA: NOT DETECTED

## 2018-08-25 LAB — URINE CULTURE
Culture: >=100000 — AB
Special Requests: NORMAL

## 2018-08-26 ENCOUNTER — Telehealth: Payer: Self-pay | Admitting: Emergency Medicine

## 2018-08-26 NOTE — Telephone Encounter (Signed)
Post ED Visit - Positive Culture Follow-up  Culture report reviewed by antimicrobial stewardship pharmacist: [facility] Team []  Elenor Quinones, Pharm.D. []  Heide Guile, Pharm.D., BCPS AQ-ID []  Parks Neptune, Pharm.D., BCPS []  Alycia Rossetti, Pharm.D., BCPS []  [place]cher, [place].D., BCPS, AAHIVP []  Legrand Como, Pharm.D., BCPS, AAHIVP []  Salome Arnt, PharmD, BCPS []  Johnnette Gourd, PharmD, BCPS []  Hughes Better, PharmD, BCPS []  Leeroy Cha, PharmD []  Laqueta Linden, PharmD, BCPS []  Albertina Parr, PharmD Elicia Lamp PharmD  [place] Team []  Leodis Sias, PharmD []  Lindell Spar, PharmD []  Royetta Asal, PharmD []  Graylin Shiver, Rph []  Rema Fendt) Glennon Mac, PharmD []  Arlyn Dunning, PharmD []  Netta Cedars, PharmD []  Dia Sitter, PharmD []  Leone Haven, PharmD []  Gretta Arab, PharmD []  Theodis Shove, PharmD []  Peggyann Juba, PharmD []  Reuel Boom, PharmD   Positive urine culture Treated with cefdinir, organism sensitive to the same and no further patient follow-up is required at this time.  Hazle Nordmann 08/26/2018, 12:17 PM

## 2018-09-22 ENCOUNTER — Other Ambulatory Visit: Payer: Self-pay | Admitting: Internal Medicine

## 2018-09-22 DIAGNOSIS — N390 Urinary tract infection, site not specified: Secondary | ICD-10-CM

## 2018-12-16 ENCOUNTER — Other Ambulatory Visit: Payer: Self-pay | Admitting: *Deleted

## 2018-12-16 DIAGNOSIS — Z20822 Contact with and (suspected) exposure to covid-19: Secondary | ICD-10-CM

## 2018-12-18 LAB — NOVEL CORONAVIRUS, NAA: SARS-CoV-2, NAA: NOT DETECTED

## 2019-06-01 ENCOUNTER — Encounter: Payer: Self-pay | Admitting: Radiology

## 2019-07-14 ENCOUNTER — Encounter: Payer: Self-pay | Admitting: Orthopedic Surgery

## 2019-07-14 ENCOUNTER — Ambulatory Visit: Payer: BC Managed Care – PPO

## 2019-07-14 ENCOUNTER — Other Ambulatory Visit: Payer: Self-pay

## 2019-07-14 ENCOUNTER — Ambulatory Visit: Payer: BC Managed Care – PPO | Admitting: Orthopedic Surgery

## 2019-07-14 VITALS — BP 155/94 | HR 86 | Ht 62.0 in | Wt 144.0 lb

## 2019-07-14 DIAGNOSIS — M25859 Other specified joint disorders, unspecified hip: Secondary | ICD-10-CM

## 2019-07-14 DIAGNOSIS — M25552 Pain in left hip: Secondary | ICD-10-CM

## 2019-07-14 DIAGNOSIS — M25551 Pain in right hip: Secondary | ICD-10-CM

## 2019-07-14 DIAGNOSIS — M25852 Other specified joint disorders, left hip: Secondary | ICD-10-CM | POA: Diagnosis not present
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
## 2019-07-14 NOTE — Patient Instructions (Signed)
You will get a call about the MRI scan  You will also get a call from Dr Romeo Apple about the report of the study  After he calls you with the report we will refer you to Dr Caswell Corwin

## 2019-07-18 DIAGNOSIS — N3020 Other chronic cystitis without hematuria: Secondary | ICD-10-CM | POA: Insufficient documentation

## 2019-07-29 ENCOUNTER — Other Ambulatory Visit: Payer: Self-pay

## 2019-07-29 ENCOUNTER — Ambulatory Visit (HOSPITAL_COMMUNITY)
Admission: RE | Admit: 2019-07-29 | Discharge: 2019-07-29 | Disposition: A | Discharge status: Home / Self Care | Payer: BC Managed Care – PPO | Source: Ambulatory Visit | Attending: Internal Medicine | Admitting: Internal Medicine

## 2019-07-29 ENCOUNTER — Other Ambulatory Visit: Payer: Self-pay | Admitting: Internal Medicine

## 2019-07-29 ENCOUNTER — Other Ambulatory Visit (HOSPITAL_COMMUNITY): Payer: Self-pay | Admitting: Internal Medicine

## 2019-07-29 DIAGNOSIS — M79604 Pain in right leg: Secondary | ICD-10-CM | POA: Diagnosis present

## 2019-07-30 ENCOUNTER — Telehealth: Payer: Self-pay | Admitting: Radiology

## 2019-07-30 NOTE — Telephone Encounter (Signed)
Can we schedule for next available?

## 2019-07-30 NOTE — Telephone Encounter (Signed)
Patient called and said that yesterday she has onset pain swelling in the right calf.  Right knee hurts so bad she can hardly walk, and she does not think it is related to the hips.  PCP sent her for doppler, results in chart, neg for DVT.  Probably needs appt, just was not sure when/where.  Can you call her to discuss?

## 2019-07-30 NOTE — Telephone Encounter (Signed)
scheduled

## 2019-08-01 DIAGNOSIS — R310 Gross hematuria: Secondary | ICD-10-CM | POA: Insufficient documentation

## 2019-08-05 ENCOUNTER — Ambulatory Visit: Payer: BC Managed Care – PPO

## 2019-08-05 ENCOUNTER — Other Ambulatory Visit: Payer: Self-pay

## 2019-08-05 ENCOUNTER — Encounter: Payer: Self-pay | Admitting: Orthopedic Surgery

## 2019-08-05 ENCOUNTER — Ambulatory Visit: Payer: BC Managed Care – PPO | Admitting: Orthopedic Surgery

## 2019-08-05 VITALS — BP 158/98 | HR 93 | Ht 62.0 in | Wt 148.0 lb

## 2019-08-05 DIAGNOSIS — M25561 Pain in right knee: Secondary | ICD-10-CM

## 2019-08-05 DIAGNOSIS — M545 Low back pain, unspecified: Secondary | ICD-10-CM

## 2019-08-05 DIAGNOSIS — F419 Anxiety disorder, unspecified: Secondary | ICD-10-CM

## 2019-08-05 DIAGNOSIS — M79604 Pain in right leg: Secondary | ICD-10-CM

## 2019-08-05 MED ORDER — PREDNISONE 10 MG (48) PO TBPK: ORAL_TABLET | Freq: Every day | ORAL | 0 refills | Status: DC

## 2019-08-05 MED ORDER — ALPRAZOLAM 0.5 MG PO TABS: 0.5000 mg | ORAL_TABLET | Freq: Every evening | ORAL | 0 refills | Status: DC | PRN

## 2019-08-05 NOTE — Progress Notes (Signed)
Chief Complaint  Patient presents with  . Knee Pain    pain right knee / behind knee is most painful    54 year old female who we are working up for possible femoral acetabular impingement syndrome presents with pain behind the right knee acute in onset associated with standing for long periods of time partially relieved with 6-day course of prednisone.  However after the initial high-dose of the 6-day course her symptoms seem to be recurring.  She actually complains of pain in the right leg radiating from the right hip down to the right leg and exacerbated by standing.  She says it is hard to bend her knee when she is having pain and has pain at terminal flexion  She did have an ultrasound to rule out a DVT that was normal and there was no popliteal cyst found  Review of systems she does not really complain of numbness tingling or weakness of the right lower extremity just a deep dull aching pain that radiates  Past Medical History:  Diagnosis Date  . Depression   . Hypertension     [phone] (home) [phone] (work)  We do not have previous tibial nailing with removal of the tibial nail  Physical Exam Constitutional:      Appearance: She is normal weight.  HENT:     Head: Normocephalic.     Nose: No congestion.     Mouth/Throat:     Mouth: Mucous membranes are moist.  Eyes:     General: No scleral icterus.    Extraocular Movements: Extraocular movements intact.     Pupils: Pupils are equal, round, and reactive to light.  Cardiovascular:     Rate and Rhythm: Normal rate.     Pulses: Normal pulses.  Skin:    Capillary Refill: Capillary refill takes less than 2 seconds.  Neurological:     General: No focal deficit present.     Mental Status: She is alert.  Psychiatric:        Mood and Affect: Mood normal.        Behavior: Behavior normal.        Thought Content: Thought content normal.        Judgment: Judgment normal.    Right lower extremity shows full range  of motion with some pain at terminal flexion there is no pain tenderness or swelling behind the knee.  There is no joint effusion.  The knee is nontender and has full range of motion otherwise.  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  This is an acute injury  uncomplicated  Medication/prescription management  Outside film and ultrasound reviewed along with the corresponding report  I do not see a popliteal cyst there was also,  In-house x-ray back and knee see report incorporated by reference no fracture dislocation mild truncal asymmetry spondylolysis without listhesis as stated  Patient worried about her MRI given Xanax one-time prescription for anxiety     Meds ordered this encounter  Medications  . predniSONE (STERAPRED UNI-PAK 48 TAB) 10 MG (48) TBPK tablet    Sig: Take by mouth daily. 12 days as directed 10    Dispense:  48 tablet    Refill:  0  . ALPRAZolam (XANAX) 0.5 MG tablet    Sig: Take 1 tablet (0.5 mg total) by mouth at bedtime as needed for anxiety.    Dispense:  1 tablet    Refill:  0     Encounter Diagnoses  Name Primary?  . Acute pain of right knee Yes  . Pain in right leg   . Lumbar pain   . Anxiety

## 2019-08-16 ENCOUNTER — Other Ambulatory Visit: Payer: BC Managed Care – PPO

## 2019-08-16 ENCOUNTER — Inpatient Hospital Stay: Admission: RE | Admit: 2019-08-16 | Payer: BC Managed Care – PPO | Source: Ambulatory Visit

## 2019-08-18 ENCOUNTER — Telehealth: Payer: Self-pay | Admitting: Orthopedic Surgery

## 2019-08-18 NOTE — Telephone Encounter (Signed)
Patient called to relay that she has just finished the 10-day Prednisone as prescribed, and states that 1 day later, her right knee already starting hurting again and is "back to where it started from."  Relays that she did not have the MRI done on her hip as ordered, and she said it is her understanding that the knee did not have anything to do with her knee. Please advise.

## 2019-08-18 NOTE — Telephone Encounter (Signed)
Pt notified, will reschedule MRI.

## 2019-08-18 NOTE — Telephone Encounter (Signed)
Need MRI before I can make any other recommendations

## 2019-09-14 ENCOUNTER — Other Ambulatory Visit: Payer: BC Managed Care – PPO

## 2019-09-14 ENCOUNTER — Inpatient Hospital Stay: Admission: RE | Admit: 2019-09-14 | Payer: BC Managed Care – PPO | Source: Ambulatory Visit

## 2019-09-15 ENCOUNTER — Telehealth: Payer: Self-pay | Admitting: Radiology

## 2019-09-15 NOTE — Telephone Encounter (Signed)
Patient no showed for the MR Arthrogram yesterday of her hip  I will call her to see if she wants to [facility]

## 2019-09-15 NOTE — Telephone Encounter (Signed)
Left message for her to call me back. 

## 2019-09-17 NOTE — Telephone Encounter (Signed)
Patient has not responded to calls to RS the MR arthrogram of the hip  I will cancel the order to you [facility]

## 2019-09-23 DIAGNOSIS — M25561 Pain in right knee: Secondary | ICD-10-CM | POA: Insufficient documentation

## 2020-01-10 ENCOUNTER — Telehealth: Payer: Self-pay | Admitting: Orthopedic Surgery

## 2020-01-10 NOTE — Telephone Encounter (Signed)
Julie Christensen called today stating that she needs [place] to get new approval for her to have MRI.  She said that previously she didn't go through with it because she couldn't afford it but she wants to proceed now.  She said that [facility] did go ahead and schedule the MRI for December 21st  but they will need new authorization.  Can you take care of this for the patient?

## 2020-01-11 NOTE — Telephone Encounter (Signed)
[employer] would not approve study the first question was has patient been evaluated for this in the past 3 months, she has not, so the denied it, she will have to be seen back in the office to order study  Can you schedule her for next available with Dr Romeo Apple to reorder it?

## 2020-01-25 ENCOUNTER — Other Ambulatory Visit: Payer: BC Managed Care – PPO

## 2020-02-03 ENCOUNTER — Ambulatory Visit: Payer: BC Managed Care – PPO | Admitting: Orthopedic Surgery

## 2020-02-03 ENCOUNTER — Encounter: Payer: Self-pay | Admitting: Orthopedic Surgery

## 2020-02-03 ENCOUNTER — Other Ambulatory Visit: Payer: Self-pay

## 2020-02-03 VITALS — BP 168/99 | HR 99 | Ht 62.0 in | Wt 155.0 lb

## 2020-02-03 DIAGNOSIS — M79604 Pain in right leg: Secondary | ICD-10-CM | POA: Diagnosis not present

## 2020-02-03 DIAGNOSIS — M545 Low back pain, unspecified: Secondary | ICD-10-CM

## 2020-02-03 DIAGNOSIS — M25859 Other specified joint disorders, unspecified hip: Secondary | ICD-10-CM

## 2020-02-03 DIAGNOSIS — E270 Other adrenocortical overactivity: Secondary | ICD-10-CM

## 2020-02-03 DIAGNOSIS — M25852 Other specified joint disorders, left hip: Secondary | ICD-10-CM

## 2020-02-03 MED ORDER — GABAPENTIN 100 MG PO CAPS: 100.0000 mg | ORAL_CAPSULE | Freq: Three times a day (TID) | ORAL | 2 refills | Status: AC

## 2020-02-03 NOTE — Patient Instructions (Addendum)
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

## 2020-02-03 NOTE — Progress Notes (Signed)
Chief Complaint  Patient presents with  . 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 Depression   . Hypertension      BP (!) 168/99   Pulse 99   Ht 5\' 2"  (1.575 m)   Wt 155 lb (70.3 kg)   BMI 28.35 kg/m   She is awake and alert she is oriented x3 mood and affect are normal she is in no acute distress but has a pain look on her face  She has a positive right straight leg raise tenderness in her lower back right side left side and central primarily between L4 and S1  Her knee is nontender except posteriorly the synovium seems to be boggy but the knee joint itself is nontender she has no pain in the front of her knee side of the knee only posteriorly  Previous images showed arthritis in the lower segments possible spondylolysis  Plan is to start her on gabapentin and get an MRI and further follow-up if needed with neurosurgery  Meds ordered this encounter  Medications  . gabapentin (NEURONTIN) 100 MG capsule    Sig: Take 1 capsule (100 mg total) by mouth 3 (three) times daily.    Dispense:  90 capsule    Refill:  2

## 2020-02-03 NOTE — Progress Notes (Signed)
Meds ordered this encounter  Medications  . gabapentin (NEURONTIN) 100 MG capsule    Sig: Take 1 capsule (100 mg total) by mouth 3 (three) times daily.    Dispense:  90 capsule    Refill:  2   ROS KNEE PAIN HAD MRI

## 2020-02-03 NOTE — Addendum Note (Signed)
Addended byCaffie Damme on: 02/03/2020 04:46 PM   Modules accepted: Orders
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
## 2020-02-09 ENCOUNTER — Encounter: Payer: Self-pay | Admitting: Rheumatology

## 2020-02-09 ENCOUNTER — Encounter: Payer: Self-pay | Admitting: *Deleted

## 2020-02-09 ENCOUNTER — Encounter: Payer: Self-pay | Admitting: Orthopedic Surgery

## 2020-02-09 ENCOUNTER — Telehealth: Payer: Self-pay

## 2020-02-09 ENCOUNTER — Other Ambulatory Visit: Payer: Self-pay

## 2020-02-09 ENCOUNTER — Ambulatory Visit: Payer: BC Managed Care – PPO | Admitting: Rheumatology

## 2020-02-09 VITALS — BP 163/110 | HR 83 | Resp 15 | Ht 62.0 in | Wt 158.4 lb

## 2020-02-09 DIAGNOSIS — M19072 Primary osteoarthritis, left ankle and foot: Secondary | ICD-10-CM

## 2020-02-09 DIAGNOSIS — M19041 Primary osteoarthritis, right hand: Secondary | ICD-10-CM

## 2020-02-09 DIAGNOSIS — G8929 Other chronic pain: Secondary | ICD-10-CM

## 2020-02-09 DIAGNOSIS — M7062 Trochanteric bursitis, left hip: Secondary | ICD-10-CM

## 2020-02-09 DIAGNOSIS — M7061 Trochanteric bursitis, right hip: Secondary | ICD-10-CM

## 2020-02-09 DIAGNOSIS — R5383 Other fatigue: Secondary | ICD-10-CM

## 2020-02-09 DIAGNOSIS — Z8639 Personal history of other endocrine, nutritional and metabolic disease: Secondary | ICD-10-CM

## 2020-02-09 DIAGNOSIS — M19042 Primary osteoarthritis, left hand: Secondary | ICD-10-CM

## 2020-02-09 DIAGNOSIS — E270 Other adrenocortical overactivity: Secondary | ICD-10-CM

## 2020-02-09 DIAGNOSIS — Q667 Congenital pes cavus, unspecified foot: Secondary | ICD-10-CM

## 2020-02-09 DIAGNOSIS — M25561 Pain in right knee: Secondary | ICD-10-CM | POA: Diagnosis not present

## 2020-02-09 DIAGNOSIS — M51369 Other intervertebral disc degeneration, lumbar region without mention of lumbar back pain or lower extremity pain: Secondary | ICD-10-CM

## 2020-02-09 DIAGNOSIS — M2550 Pain in unspecified joint: Secondary | ICD-10-CM | POA: Diagnosis not present

## 2020-02-09 DIAGNOSIS — Z8719 Personal history of other diseases of the digestive system: Secondary | ICD-10-CM

## 2020-02-09 DIAGNOSIS — M19071 Primary osteoarthritis, right ankle and foot: Secondary | ICD-10-CM

## 2020-02-09 DIAGNOSIS — N3020 Other chronic cystitis without hematuria: Secondary | ICD-10-CM

## 2020-02-09 DIAGNOSIS — M5136 Other intervertebral disc degeneration, lumbar region: Secondary | ICD-10-CM

## 2020-02-09 NOTE — Telephone Encounter (Signed)
OK to adjust note to 2022.

## 2020-02-09 NOTE — Patient Instructions (Signed)
Journal for Nurse Practitioners, 15(4), 263-267. Retrieved November 10, 2017 from [email or web address]">  Knee Exercises Ask your health care provider which exercises are safe for you. Do exercises exactly as told by your health care provider and adjust them as directed. It is normal to feel mild stretching, pulling, tightness, or discomfort as you do these exercises. Stop right away if you feel sudden pain or your pain gets worse. Do not begin these exercises until told by your health care provider. Stretching and range-of-motion exercises These exercises warm up your muscles and joints and improve the movement and flexibility of your knee. These exercises also help to relieve pain and swelling. Knee extension, prone 1. Lie on your abdomen (prone position) on a bed. 2. Place your left / right knee just beyond the edge of the surface so your knee is not on the bed. You can put a towel under your left / right thigh just above your kneecap for comfort. 3. Relax your leg muscles and allow gravity to straighten your knee (extension). You should feel a stretch behind your left / right knee. 4. Hold this position for __________ seconds. 5. Scoot up so your knee is supported between repetitions. Repeat __________ times. Complete this exercise __________ times a day. Knee flexion, active  1. Lie on your back with both legs straight. If this causes back discomfort, bend your left / right knee so your foot is flat on the floor. 2. Slowly slide your left / right heel back toward your buttocks. Stop when you feel a gentle stretch in the front of your knee or thigh (flexion). 3. Hold this position for __________ seconds. 4. Slowly slide your left / right heel back to the starting position. Repeat __________ times. Complete this exercise __________ times a day. Quadriceps stretch, prone  1. Lie on your abdomen on a firm surface, such as a bed or padded floor. 2. Bend your left / right knee and hold  your ankle. If you cannot reach your ankle or pant leg, loop a belt around your foot and grab the belt instead. 3. Gently pull your heel toward your buttocks. Your knee should not slide out to the side. You should feel a stretch in the front of your thigh and knee (quadriceps). 4. Hold this position for __________ seconds. Repeat __________ times. Complete this exercise __________ times a day. Hamstring, supine 1. Lie on your back (supine position). 2. Loop a belt or towel over the ball of your left / right foot. The ball of your foot is on the walking surface, right under your toes. 3. Straighten your left / right knee and slowly pull on the belt to raise your leg until you feel a gentle stretch behind your knee (hamstring). ? Do not let your knee bend while you do this. ? Keep your other leg flat on the floor. 4. Hold this position for __________ seconds. Repeat __________ times. Complete this exercise __________ times a day. Strengthening exercises These exercises build strength and endurance in your knee. Endurance is the ability to use your muscles for a long time, even after they get tired. Quadriceps, isometric This exercise stretches the muscles in front of your thigh (quadriceps) without moving your knee joint (isometric). 1. Lie on your back with your left / right leg extended and your other knee bent. Put a rolled towel or small pillow under your knee if told by your health care provider. 2. Slowly tense the muscles in the front of your left /   right thigh. You should see your kneecap slide up toward your hip or see increased dimpling just above the knee. This motion will push the back of the knee toward the floor. 3. For __________ seconds, hold the muscle as tight as you can without increasing your pain. 4. Relax the muscles slowly and completely. Repeat __________ times. Complete this exercise __________ times a day. Straight leg raises This exercise stretches the muscles in front  of your thigh (quadriceps) and the muscles that move your hips (hip flexors). 1. Lie on your back with your left / right leg extended and your other knee bent. 2. Tense the muscles in the front of your left / right thigh. You should see your kneecap slide up or see increased dimpling just above the knee. Your thigh may even shake a bit. 3. Keep these muscles tight as you raise your leg 4-6 inches (10-15 cm) off the floor. Do not let your knee bend. 4. Hold this position for __________ seconds. 5. Keep these muscles tense as you lower your leg. 6. Relax your muscles slowly and completely after each repetition. Repeat __________ times. Complete this exercise __________ times a day. Hamstring, isometric 1. Lie on your back on a firm surface. 2. Bend your left / right knee about __________ degrees. 3. Dig your left / right heel into the surface as if you are trying to pull it toward your buttocks. Tighten the muscles in the back of your thighs (hamstring) to "dig" as hard as you can without increasing any pain. 4. Hold this position for __________ seconds. 5. Release the tension gradually and allow your muscles to relax completely for __________ seconds after each repetition. Repeat __________ times. Complete this exercise __________ times a day. Hamstring curls If told by your health care provider, do this exercise while wearing ankle weights. Begin with __________ lb weights. Then increase the weight by 1 lb (0.5 kg) increments. Do not wear ankle weights that are more than __________ lb. 1. Lie on your abdomen with your legs straight. 2. Bend your left / right knee as far as you can without feeling pain. Keep your hips flat against the floor. 3. Hold this position for __________ seconds. 4. Slowly lower your leg to the starting position. Repeat __________ times. Complete this exercise __________ times a day. Squats This exercise strengthens the muscles in front of your thigh and knee  (quadriceps). 1. Stand in front of a table, with your feet and knees pointing straight ahead. You may rest your hands on the table for balance but not for support. 2. Slowly bend your knees and lower your hips like you are going to sit in a chair. ? Keep your weight over your heels, not over your toes. ? Keep your lower legs upright so they are parallel with the table legs. ? Do not let your hips go lower than your knees. ? Do not bend lower than told by your health care provider. ? If your knee pain increases, do not bend as low. 3. Hold the squat position for __________ seconds. 4. Slowly push with your legs to return to standing. Do not use your hands to pull yourself to standing. Repeat __________ times. Complete this exercise __________ times a day. Wall slides This exercise strengthens the muscles in front of your thigh and knee (quadriceps). 1. Lean your back against a smooth wall or door, and walk your feet out 18-24 inches (46-61 cm) from it. 2. Place your feet hip-width apart. 3.   Slowly slide down the wall or door until your knees bend __________ degrees. Keep your knees over your heels, not over your toes. Keep your knees in line with your hips. 4. Hold this position for __________ seconds. Repeat __________ times. Complete this exercise __________ times a day. Straight leg raises This exercise strengthens the muscles that rotate the leg at the hip and move it away from your body (hip abductors). 1. Lie on your side with your left / right leg in the top position. Lie so your head, shoulder, knee, and hip line up. You may bend your bottom knee to help you keep your balance. 2. Roll your hips slightly forward so your hips are stacked directly over each other and your left / right knee is facing forward. 3. Leading with your heel, lift your top leg 4-6 inches (10-15 cm). You should feel the muscles in your outer hip lifting. ? Do not let your foot drift forward. ? Do not let your knee  roll toward the ceiling. 4. Hold this position for __________ seconds. 5. Slowly return your leg to the starting position. 6. Let your muscles relax completely after each repetition. Repeat __________ times. Complete this exercise __________ times a day. Straight leg raises This exercise stretches the muscles that move your hips away from the front of the pelvis (hip extensors). 1. Lie on your abdomen on a firm surface. You can put a pillow under your hips if that is more comfortable. 2. Tense the muscles in your buttocks and lift your left / right leg about 4-6 inches (10-15 cm). Keep your knee straight as you lift your leg. 3. Hold this position for __________ seconds. 4. Slowly lower your leg to the starting position. 5. Let your leg relax completely after each repetition. Repeat __________ times. Complete this exercise __________ times a day. This information is not intended to replace advice given to you by your health care provider. Make sure you discuss any questions you have with your health care provider. Document Revised: 11/11/2017 Document Reviewed: 11/11/2017 [employer] Patient Education  2020 [employer] Iliotibial Band Syndrome Rehab Ask your health care provider which exercises are safe for you. Do exercises exactly as told by your health care provider and adjust them as directed. It is normal to feel mild stretching, pulling, tightness, or discomfort as you do these exercises. Stop right away if you feel sudden pain or your pain gets significantly worse. Do not begin these exercises until told by your health care provider. Stretching and range-of-motion exercises These exercises warm up your muscles and joints and improve the movement and flexibility of your hip and pelvis. Quadriceps stretch, prone  6. Lie on your abdomen on a firm surface, such as a bed or padded floor (prone position). 7. Bend your left / right knee and reach back to hold your ankle or pant leg. If you  cannot reach your ankle or pant leg, loop a belt around your foot and grab the belt instead. 8. Gently pull your heel toward your buttocks. Your knee should not slide out to the side. You should feel a stretch in the front of your thigh and knee (quadriceps). 9. Hold this position for __________ seconds. Repeat __________ times. Complete this exercise __________ times a day. Iliotibial band stretch An iliotibial band is a strong band of muscle tissue that runs from the outer side of your hip to the outer side of your thigh and knee. 5. Lie on your side with your left /   right leg in the top position. 6. Bend both of your knees and grab your left / right ankle. Stretch out your bottom arm to help you balance. 7. Slowly bring your top knee back so your thigh goes behind your trunk. 8. Slowly lower your top leg toward the floor until you feel a gentle stretch on the outside of your left / right hip and thigh. If you do not feel a stretch and your knee will not fall farther, place the heel of your other foot on top of your knee and pull your knee down toward the floor with your foot. 9. Hold this position for __________ seconds. Repeat __________ times. Complete this exercise __________ times a day. Strengthening exercises These exercises build strength and endurance in your hip and pelvis. Endurance is the ability to use your muscles for a long time, even after they get tired. Straight leg raises, side-lying This exercise strengthens the muscles that rotate the leg at the hip and move it away from your body (hip abductors). 5. Lie on your side with your left / right leg in the top position. Lie so your head, shoulder, hip, and knee line up. You may bend your bottom knee to help you balance. 6. Roll your hips slightly forward so your hips are stacked directly over each other and your left / right knee is facing forward. 7. Tense the muscles in your outer thigh and lift your top leg 4-6 inches (10-15  cm). 8. Hold this position for __________ seconds. 9. Slowly return to the starting position. Let your muscles relax completely before doing another repetition. Repeat __________ times. Complete this exercise __________ times a day. Leg raises, prone This exercise strengthens the muscles that move the hips (hip extensors). 5. Lie on your abdomen on your bed or a firm surface. You can put a pillow under your hips if that is more comfortable for your lower back. 6. Bend your left / right knee so your foot is straight up in the air. 7. Squeeze your buttocks muscles and lift your left / right thigh off the bed. Do not let your back arch. 8. Tense your thigh muscle as hard as you can without increasing any knee pain. 9. Hold this position for __________ seconds. 10. Slowly lower your leg to the starting position and allow it to relax completely. Repeat __________ times. Complete this exercise __________ times a day. Hip hike 5. Stand sideways on a bottom step. Stand on your left / right leg with your other foot unsupported next to the step. You can hold on to the railing or wall for balance if needed. 6. Keep your knees straight and your torso square. Then lift your left / right hip up toward the ceiling. 7. Slowly let your left / right hip lower toward the floor, past the starting position. Your foot should get closer to the floor. Do not lean or bend your knees. Repeat __________ times. Complete this exercise __________ times a day. This information is not intended to replace advice given to you by your health care provider. Make sure you discuss any questions you have with your health care provider. Document Revised: 05/14/2018 Document Reviewed: 11/12/2017 [employer] Patient Education  2020 [employer]  Back Exercises The following exercises strengthen the muscles that help to support the trunk and back. They also help to keep the lower back flexible. Doing these exercises can help to prevent  back pain or lessen existing pain.  If you have back   pain or discomfort, try doing these exercises 2-3 times each day or as told by your health care provider.  As your pain improves, do them once each day, but increase the number of times that you repeat the steps for each exercise (do more repetitions).  To prevent the recurrence of back pain, continue to do these exercises once each day or as told by your health care provider. Do exercises exactly as told by your health care provider and adjust them as directed. It is normal to feel mild stretching, pulling, tightness, or discomfort as you do these exercises, but you should stop right away if you feel sudden pain or your pain gets worse. Exercises Single knee to chest Repeat these steps 3-5 times for each leg: 1. Lie on your back on a firm bed or the floor with your legs extended. 2. Bring one knee to your chest. Your other leg should stay extended and in contact with the floor. 3. Hold your knee in place by grabbing your knee or thigh with both hands and hold. 4. Pull on your knee until you feel a gentle stretch in your lower back or buttocks. 5. Hold the stretch for 10-30 seconds. 6. Slowly release and straighten your leg. Pelvic tilt Repeat these steps 5-10 times: 1. Lie on your back on a firm bed or the floor with your legs extended. 2. Bend your knees so they are pointing toward the ceiling and your feet are flat on the floor. 3. Tighten your lower abdominal muscles to press your lower back against the floor. This motion will tilt your pelvis so your tailbone points up toward the ceiling instead of pointing to your feet or the floor. 4. With gentle tension and even breathing, hold this position for 5-10 seconds. Cat-cow Repeat these steps until your lower back becomes more flexible: 1. Get into a hands-and-knees position on a firm surface. Keep your hands under your shoulders, and keep your knees under your hips. You may place padding  under your knees for comfort. 2. Let your head hang down toward your chest. Contract your abdominal muscles and point your tailbone toward the floor so your lower back becomes rounded like the back of a cat. 3. Hold this position for 5 seconds. 4. Slowly lift your head, let your abdominal muscles relax and point your tailbone up toward the ceiling so your back forms a sagging arch like the back of a cow. 5. Hold this position for 5 seconds.  Press-ups Repeat these steps 5-10 times: 1. Lie on your abdomen (face-down) on the floor. 2. Place your palms near your head, about shoulder-width apart. 3. Keeping your back as relaxed as possible and keeping your hips on the floor, slowly straighten your arms to raise the top half of your body and lift your shoulders. Do not use your back muscles to raise your upper torso. You may adjust the placement of your hands to make yourself more comfortable. 4. Hold this position for 5 seconds while you keep your back relaxed. 5. Slowly return to lying flat on the floor.  Bridges Repeat these steps 10 times: 1. Lie on your back on a firm surface. 2. Bend your knees so they are pointing toward the ceiling and your feet are flat on the floor. Your arms should be flat at your sides, next to your body. 3. Tighten your buttocks muscles and lift your buttocks off the floor until your waist is at almost the same height as your knees.   You should feel the muscles working in your buttocks and the back of your thighs. If you do not feel these muscles, slide your feet 1-2 inches farther away from your buttocks. 4. Hold this position for 3-5 seconds. 5. Slowly lower your hips to the starting position, and allow your buttocks muscles to relax completely. If this exercise is too easy, try doing it with your arms crossed over your chest. Abdominal crunches Repeat these steps 5-10 times: 1. Lie on your back on a firm bed or the floor with your legs extended. 2. Bend your knees  so they are pointing toward the ceiling and your feet are flat on the floor. 3. Cross your arms over your chest. 4. Tip your chin slightly toward your chest without bending your neck. 5. Tighten your abdominal muscles and slowly raise your trunk (torso) high enough to lift your shoulder blades a tiny bit off the floor. Avoid raising your torso higher than that because it can put too much stress on your low back and does not help to strengthen your abdominal muscles. 6. Slowly return to your starting position. Back lifts Repeat these steps 5-10 times: 1. Lie on your abdomen (face-down) with your arms at your sides, and rest your forehead on the floor. 2. Tighten the muscles in your legs and your buttocks. 3. Slowly lift your chest off the floor while you keep your hips pressed to the floor. Keep the back of your head in line with the curve in your back. Your eyes should be looking at the floor. 4. Hold this position for 3-5 seconds. 5. Slowly return to your starting position. Contact a health care provider if:  Your back pain or discomfort gets much worse when you do an exercise.  Your worsening back pain or discomfort does not lessen within 2 hours after you exercise. If you have any of these problems, stop doing these exercises right away. Do not do them again unless your health care provider says that you can. Get help right away if:  You develop sudden, severe back pain. If this happens, stop doing the exercises right away. Do not do them again unless your health care provider says that you can. This information is not intended to replace advice given to you by your health care provider. Make sure you discuss any questions you have with your health care provider. Document Revised: 05/28/2018 Document Reviewed: 10/23/2017 Elsevier Patient Education  2020 [employer]  

## 2020-02-09 NOTE — Telephone Encounter (Signed)
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 She said it needs to be 2022. Please advise.

## 2020-02-15 LAB — 14-3-3 ETA PROTEIN: 14-3-3 eta Protein: <0.2 ng/mL (ref <0.2)

## 2020-02-15 LAB — SEDIMENTATION RATE: Sed Rate: 6 mm/h (ref 0–30)

## 2020-02-15 LAB — ANGIOTENSIN CONVERTING ENZYME: Angiotensin-Converting Enzyme: 11 U/L (ref 9–67)

## 2020-02-15 LAB — CYCLIC CITRUL PEPTIDE ANTIBODY, IGG: Cyclic Citrullin Peptide Ab: <16 UNITS

## 2020-02-15 LAB — RHEUMATOID FACTOR: Rheumatoid fact SerPl-aCnc: <14 IU/mL (ref <14)

## 2020-02-16 NOTE — Progress Notes (Signed)
All the labs are within normal limits.  I will discuss results at the follow-up visit.

## 2020-02-28 ENCOUNTER — Ambulatory Visit (HOSPITAL_COMMUNITY)
Admission: RE | Admit: 2020-02-28 | Discharge: 2020-02-28 | Disposition: A | Discharge status: Home / Self Care | Payer: BC Managed Care – PPO | Source: Ambulatory Visit | Attending: Orthopedic Surgery | Admitting: Orthopedic Surgery

## 2020-02-28 ENCOUNTER — Ambulatory Visit (HOSPITAL_COMMUNITY): Admission: RE | Admit: 2020-02-28 | Payer: BC Managed Care – PPO | Source: Ambulatory Visit

## 2020-02-28 ENCOUNTER — Other Ambulatory Visit: Payer: Self-pay

## 2020-02-28 DIAGNOSIS — M79604 Pain in right leg: Secondary | ICD-10-CM | POA: Diagnosis present

## 2020-02-28 DIAGNOSIS — M25859 Other specified joint disorders, unspecified hip: Secondary | ICD-10-CM

## 2020-02-28 DIAGNOSIS — M545 Low back pain, unspecified: Secondary | ICD-10-CM | POA: Insufficient documentation

## 2020-03-01 ENCOUNTER — Other Ambulatory Visit: Payer: Self-pay

## 2020-03-01 ENCOUNTER — Ambulatory Visit (HOSPITAL_COMMUNITY)
Admission: RE | Admit: 2020-03-01 | Discharge: 2020-03-01 | Disposition: A | Discharge status: Home / Self Care | Payer: BC Managed Care – PPO | Source: Ambulatory Visit | Attending: Orthopedic Surgery | Admitting: Orthopedic Surgery

## 2020-03-01 ENCOUNTER — Encounter (HOSPITAL_COMMUNITY): Payer: Self-pay

## 2020-03-01 DIAGNOSIS — M25859 Other specified joint disorders, unspecified hip: Secondary | ICD-10-CM | POA: Insufficient documentation

## 2020-03-01 MED ORDER — SODIUM CHLORIDE (PF) 0.9 % IJ SOLN
INTRAMUSCULAR | Status: AC
  Administered 2020-03-01: 10 mL
  Filled 2020-03-01: qty 10

## 2020-03-01 MED ORDER — LIDOCAINE HCL (PF) 1 % IJ SOLN
INTRAMUSCULAR | Status: AC
  Administered 2020-03-01: 5 mL
  Filled 2020-03-01: qty 5

## 2020-03-01 MED ORDER — POVIDONE-IODINE 10 % EX SOLN
CUTANEOUS | Status: AC
  Administered 2020-03-01: 1
  Filled 2020-03-01: qty 15

## 2020-03-01 MED ORDER — GADOBUTROL 1 MMOL/ML IV SOLN
2.0000 mL | Freq: Once | INTRAVENOUS | Status: AC | PRN
  Administered 2020-03-01: 0.05 mL

## 2020-03-01 MED ORDER — IOHEXOL 180 MG/ML  SOLN
20.0000 mL | Freq: Once | INTRAMUSCULAR | Status: AC | PRN
  Administered 2020-03-01: 15 mL via INTRA_ARTICULAR

## 2020-03-01 NOTE — Procedures (Signed)
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 Transiently vasovagal

## 2020-03-02 ENCOUNTER — Other Ambulatory Visit (HOSPITAL_COMMUNITY): Payer: BC Managed Care – PPO

## 2020-03-20 ENCOUNTER — Ambulatory Visit (INDEPENDENT_AMBULATORY_CARE_PROVIDER_SITE_OTHER): Payer: BC Managed Care – PPO | Admitting: Orthopedic Surgery

## 2020-03-20 ENCOUNTER — Encounter: Payer: Self-pay | Admitting: Orthopedic Surgery

## 2020-03-20 ENCOUNTER — Other Ambulatory Visit: Payer: Self-pay

## 2020-03-20 VITALS — BP 137/89 | HR 98 | Ht 62.0 in | Wt 155.0 lb

## 2020-03-20 DIAGNOSIS — M79604 Pain in right leg: Secondary | ICD-10-CM

## 2020-03-20 DIAGNOSIS — M25859 Other specified joint disorders, unspecified hip: Secondary | ICD-10-CM

## 2020-03-20 DIAGNOSIS — S73191A Other sprain of right hip, initial encounter: Secondary | ICD-10-CM

## 2020-03-20 DIAGNOSIS — M25851 Other specified joint disorders, right hip: Secondary | ICD-10-CM

## 2020-03-20 DIAGNOSIS — M545 Low back pain, unspecified: Secondary | ICD-10-CM

## 2020-03-20 NOTE — Progress Notes (Signed)
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 bilateral hip pain in the groin and crease of the right and left hip right greater than left with difficulty walking and experiencing numbness front of her thighs at night she took some ibuprofen ice to her hips and rested seem to give her some although incomplete relief  The patient also underwent a course of physical therapy which did not help   Last visit: 55 year old female who was evaluated for possible femoral acetabular impingement presents now with pain in her right buttock radiating down the posterior aspect of her right leg associated with back pain difficulty sitting trouble standing after sitting for long period of time 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   She has been treated with anti-inflammatories and steroid Dosepak   Review of systems MRI right knee shows synovitis Baker's cyst but no intra-articular structural damage   She is having severe pain and presented back to [place] for further diagnostic treatment    First the MRI of the hip shows a labral tear  The MRI of the back shows a degenerative condition at L3-4 with disc bulge and facet arthritis there is some subarticular stenosis without nerve root impingement  Report of the MRI of the spine   IMPRESSION: Mild lumbar and lower thoracic spondylosis, as outlined and with findings most notably as follows.   At L3-L4, there is mild disc degeneration. Small disc bulge. Mild facet arthrosis. Mild bilateral subarticular stenosis without appreciable nerve root impingement. No significant foraminal narrowing.   At T11-T12, there is a small disc bulge without significant canal or foraminal stenosis.     Electronically Signed   By: Jackey Loge DO   On: 02/28/2020 16:31  Report on the MRI of the hip  IMPRESSION: 1. Torn anterior  superior labrum of the right hip. 2. Trace right trochanteric bursitis. 3. Mild degenerative chondral thinning in the right hip. Mild spurring of the right femoral head. 4. Disc desiccation at L5-S1.     Electronically Signed   By: Gaylyn Rong M.D.   On: 03/02/2020 108:32  55 year old female with hip pain and elements of back pain:  She has had a sequence of events complaining of pain in the right buttock running down the posterior aspect of her right leg associated with back pain difficulty sitting and standing for long periods of time.  She had a anti-inflammatory and a steroid Dosepak.  We also had a course of gabapentin 100 mg 3 times a day  Now symptoms are: Complaining of difficulty with her ADLs hard to stand hard to bend over from the hip particular socks on hard to change her shoes  The lumbar spine seems to be nonsurgical patient is opting for home exercises versus formal PT  She will need referral to a hip specialist for the labral tear which seems to be symptomatic

## 2020-03-20 NOTE — Patient Instructions (Addendum)
Home exercises for the back   Hip referrral for labral tear   continue tramadol.     Back Exercises The following exercises strengthen the muscles that help to support the trunk and back. They also help to keep the lower back flexible. Doing these exercises can help to prevent back pain or lessen existing pain.  If you have back pain or discomfort, try doing these exercises 2-3 times each day or as told by your health care provider.  As your pain improves, do them once each day, but increase the number of times that you repeat the steps for each exercise (do more repetitions).  To prevent the recurrence of back pain, continue to do these exercises once each day or as told by your health care provider. Do exercises exactly as told by your health care provider and adjust them as directed. It is normal to feel mild stretching, pulling, tightness, or discomfort as you do these exercises, but you should stop right away if you feel sudden pain or your pain gets worse. Exercises Single knee to chest Repeat these steps 3-5 times for each leg: 1. Lie on your back on a firm bed or the floor with your legs extended. 2. Bring one knee to your chest. Your other leg should stay extended and in contact with the floor. 3. Hold your knee in place by grabbing your knee or thigh with both hands and hold. 4. Pull on your knee until you feel a gentle stretch in your lower back or buttocks. 5. Hold the stretch for 10-30 seconds. 6. Slowly release and straighten your leg. Pelvic tilt Repeat these steps 5-10 times: 1. Lie on your back on a firm bed or the floor with your legs extended. 2. Bend your knees so they are pointing toward the ceiling and your feet are flat on the floor. 3. Tighten your lower abdominal muscles to press your lower back against the floor. This motion will tilt your pelvis so your tailbone points up toward the ceiling instead of pointing to your feet or the floor. 4. With gentle  tension and even breathing, hold this position for 5-10 seconds. Cat-cow Repeat these steps until your lower back becomes more flexible: 1. Get into a hands-and-knees position on a firm surface. Keep your hands under your shoulders, and keep your knees under your hips. You may place padding under your knees for comfort. 2. Let your head hang down toward your chest. Contract your abdominal muscles and point your tailbone toward the floor so your lower back becomes rounded like the back of a cat. 3. Hold this position for 5 seconds. 4. Slowly lift your head, let your abdominal muscles relax and point your tailbone up toward the ceiling so your back forms a sagging arch like the back of a cow. 5. Hold this position for 5 seconds.   Press-ups Repeat these steps 5-10 times: 1. Lie on your abdomen (face-down) on the floor. 2. Place your palms near your head, about shoulder-width apart. 3. Keeping your back as relaxed as possible and keeping your hips on the floor, slowly straighten your arms to raise the top half of your body and lift your shoulders. Do not use your back muscles to raise your upper torso. You may adjust the placement of your hands to make yourself more comfortable. 4. Hold this position for 5 seconds while you keep your back relaxed. 5. Slowly return to lying flat on the floor.   Bridges Repeat these steps 10  times: 1. Lie on your back on a firm surface. 2. Bend your knees so they are pointing toward the ceiling and your feet are flat on the floor. Your arms should be flat at your sides, next to your body. 3. Tighten your buttocks muscles and lift your buttocks off the floor until your waist is at almost the same height as your knees. You should feel the muscles working in your buttocks and the back of your thighs. If you do not feel these muscles, slide your feet 1-2 inches farther away from your buttocks. 4. Hold this position for 3-5 seconds. 5. Slowly lower your hips to the  starting position, and allow your buttocks muscles to relax completely. If this exercise is too easy, try doing it with your arms crossed over your chest.   Abdominal crunches Repeat these steps 5-10 times: 1. Lie on your back on a firm bed or the floor with your legs extended. 2. Bend your knees so they are pointing toward the ceiling and your feet are flat on the floor. 3. Cross your arms over your chest. 4. Tip your chin slightly toward your chest without bending your neck. 5. Tighten your abdominal muscles and slowly raise your trunk (torso) high enough to lift your shoulder blades a tiny bit off the floor. Avoid raising your torso higher than that because it can put too much stress on your low back and does not help to strengthen your abdominal muscles. 6. Slowly return to your starting position. Back lifts Repeat these steps 5-10 times: 1. Lie on your abdomen (face-down) with your arms at your sides, and rest your forehead on the floor. 2. Tighten the muscles in your legs and your buttocks. 3. Slowly lift your chest off the floor while you keep your hips pressed to the floor. Keep the back of your head in line with the curve in your back. Your eyes should be looking at the floor. 4. Hold this position for 3-5 seconds. 5. Slowly return to your starting position. Contact a health care provider if:  Your back pain or discomfort gets much worse when you do an exercise.  Your worsening back pain or discomfort does not lessen within 2 hours after you exercise. If you have any of these problems, stop doing these exercises right away. Do not do them again unless your health care provider says that you can. Get help right away if:  You develop sudden, severe back pain. If this happens, stop doing the exercises right away. Do not do them again unless your health care provider says that you can. This information is not intended to replace advice given to you by your health care provider. Make  sure you discuss any questions you have with your health care provider. Document Revised: 05/28/2018 Document Reviewed: 10/23/2017 Elsevier Patient Education  2021 [employer].

## 2020-03-23 ENCOUNTER — Ambulatory Visit: Payer: BC Managed Care – PPO | Admitting: Rheumatology
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
## 2020-03-29 ENCOUNTER — Ambulatory Visit: Payer: BC Managed Care – PPO | Admitting: Rheumatology

## 2020-03-29 ENCOUNTER — Encounter: Payer: Self-pay | Admitting: Rheumatology

## 2020-03-29 ENCOUNTER — Other Ambulatory Visit: Payer: Self-pay

## 2020-03-29 VITALS — BP 138/89 | HR 98 | Resp 16 | Ht 62.0 in | Wt 152.8 lb

## 2020-03-29 DIAGNOSIS — M25551 Pain in right hip: Secondary | ICD-10-CM | POA: Diagnosis not present

## 2020-03-29 DIAGNOSIS — M25561 Pain in right knee: Secondary | ICD-10-CM | POA: Diagnosis not present

## 2020-03-29 DIAGNOSIS — M19041 Primary osteoarthritis, right hand: Secondary | ICD-10-CM

## 2020-03-29 DIAGNOSIS — M51369 Other intervertebral disc degeneration, lumbar region without mention of lumbar back pain or lower extremity pain: Secondary | ICD-10-CM

## 2020-03-29 DIAGNOSIS — Z8719 Personal history of other diseases of the digestive system: Secondary | ICD-10-CM

## 2020-03-29 DIAGNOSIS — M19071 Primary osteoarthritis, right ankle and foot: Secondary | ICD-10-CM

## 2020-03-29 DIAGNOSIS — M5136 Other intervertebral disc degeneration, lumbar region: Secondary | ICD-10-CM

## 2020-03-29 DIAGNOSIS — M7061 Trochanteric bursitis, right hip: Secondary | ICD-10-CM | POA: Diagnosis not present

## 2020-03-29 DIAGNOSIS — H9202 Otalgia, left ear: Secondary | ICD-10-CM

## 2020-03-29 DIAGNOSIS — Z8639 Personal history of other endocrine, nutritional and metabolic disease: Secondary | ICD-10-CM

## 2020-03-29 DIAGNOSIS — M5134 Other intervertebral disc degeneration, thoracic region: Secondary | ICD-10-CM

## 2020-03-29 DIAGNOSIS — N3020 Other chronic cystitis without hematuria: Secondary | ICD-10-CM

## 2020-03-29 DIAGNOSIS — M19072 Primary osteoarthritis, left ankle and foot: Secondary | ICD-10-CM

## 2020-03-29 DIAGNOSIS — M19042 Primary osteoarthritis, left hand: Secondary | ICD-10-CM

## 2020-03-29 DIAGNOSIS — M7062 Trochanteric bursitis, left hip: Secondary | ICD-10-CM

## 2020-03-29 DIAGNOSIS — R5383 Other fatigue: Secondary | ICD-10-CM

## 2020-03-29 DIAGNOSIS — G8929 Other chronic pain: Secondary | ICD-10-CM

## 2020-03-29 DIAGNOSIS — E270 Other adrenocortical overactivity: Secondary | ICD-10-CM

## 2020-03-29 NOTE — Patient Instructions (Addendum)
Hand Exercises Hand exercises can be helpful for almost anyone. These exercises can strengthen the hands, improve flexibility and movement, and increase blood flow to the hands. These results can make work and daily tasks easier. Hand exercises can be especially helpful for people who have joint pain from arthritis or have nerve damage from overuse (carpal tunnel syndrome). These exercises can also help people who have injured a hand. Exercises Most of these hand exercises are gentle stretching and motion exercises. It is usually safe to do them often throughout the day. Warming up your hands before exercise may help to reduce stiffness. You can do this with gentle massage or by placing your hands in warm water for 10-15 minutes. It is normal to feel some stretching, pulling, tightness, or mild discomfort as you begin new exercises. This will gradually improve. Stop an exercise right away if you feel sudden, severe pain or your pain gets worse. Ask your health care provider which exercises are best for you. Knuckle bend or "claw" fist 1. Stand or sit with your arm, hand, and all five fingers pointed straight up. Make sure to keep your wrist straight during the exercise. 2. Gently bend your fingers down toward your palm until the tips of your fingers are touching the top of your palm. Keep your big knuckle straight and just bend the small knuckles in your fingers. 3. Hold this position for __________ seconds. 4. Straighten (extend) your fingers back to the starting position. Repeat this exercise 5-10 times with each hand. Full finger fist 1. Stand or sit with your arm, hand, and all five fingers pointed straight up. Make sure to keep your wrist straight during the exercise. 2. Gently bend your fingers into your palm until the tips of your fingers are touching the middle of your palm. 3. Hold this position for __________ seconds. 4. Extend your fingers back to the starting position, stretching every  joint fully. Repeat this exercise 5-10 times with each hand. Straight fist 1. Stand or sit with your arm, hand, and all five fingers pointed straight up. Make sure to keep your wrist straight during the exercise. 2. Gently bend your fingers at the big knuckle, where your fingers meet your hand, and the middle knuckle. Keep the knuckle at the tips of your fingers straight and try to touch the bottom of your palm. 3. Hold this position for __________ seconds. 4. Extend your fingers back to the starting position, stretching every joint fully. Repeat this exercise 5-10 times with each hand. Tabletop 1. Stand or sit with your arm, hand, and all five fingers pointed straight up. Make sure to keep your wrist straight during the exercise. 2. Gently bend your fingers at the big knuckle, where your fingers meet your hand, as far down as you can while keeping the small knuckles in your fingers straight. Think of forming a tabletop with your fingers. 3. Hold this position for __________ seconds. 4. Extend your fingers back to the starting position, stretching every joint fully. Repeat this exercise 5-10 times with each hand. Finger spread 1. Place your hand flat on a table with your palm facing down. Make sure your wrist stays straight as you do this exercise. 2. Spread your fingers and thumb apart from each other as far as you can until you feel a gentle stretch. Hold this position for __________ seconds. 3. Bring your fingers and thumb tight together again. Hold this position for __________ seconds. Repeat this exercise 5-10 times with each hand.   Making circles 1. Stand or sit with your arm, hand, and all five fingers pointed straight up. Make sure to keep your wrist straight during the exercise. 2. Make a circle by touching the tip of your thumb to the tip of your index finger. 3. Hold for __________ seconds. Then open your hand wide. 4. Repeat this motion with your thumb and each finger on your  hand. Repeat this exercise 5-10 times with each hand. Thumb motion 1. Sit with your forearm resting on a table and your wrist straight. Your thumb should be facing up toward the ceiling. Keep your fingers relaxed as you move your thumb. 2. Lift your thumb up as high as you can toward the ceiling. Hold for __________ seconds. 3. Bend your thumb across your palm as far as you can, reaching the tip of your thumb for the small finger (pinkie) side of your palm. Hold for __________ seconds. Repeat this exercise 5-10 times with each hand. Grip strengthening 1. Hold a stress ball or other soft ball in the middle of your hand. 2. Slowly increase the pressure, squeezing the ball as much as you can without causing pain. Think of bringing the tips of your fingers into the middle of your palm. All of your finger joints should bend when doing this exercise. 3. Hold your squeeze for __________ seconds, then relax. Repeat this exercise 5-10 times with each hand.   Contact a health care provider if:  Your hand pain or discomfort gets much worse when you do an exercise.  Your hand pain or discomfort does not improve within 2 hours after you exercise. If you have any of these problems, stop doing these exercises right away. Do not do them again unless your health care provider says that you can. Get help right away if:  You develop sudden, severe hand pain or swelling. If this happens, stop doing these exercises right away. Do not do them again unless your health care provider says that you can. This information is not intended to replace advice given to you by your health care provider. Make sure you discuss any questions you have with your health care provider. Document Revised: 05/14/2018 Document Reviewed: 01/22/2018 [employer] Patient Education  2021 [employer] Back Exercises The following exercises strengthen the muscles that help to support the trunk and back. They also help to keep the lower back  flexible. Doing these exercises can help to prevent back pain or lessen existing pain.  If you have back pain or discomfort, try doing these exercises 2-3 times each day or as told by your health care provider.  As your pain improves, do them once each day, but increase the number of times that you repeat the steps for each exercise (do more repetitions).  To prevent the recurrence of back pain, continue to do these exercises once each day or as told by your health care provider. Do exercises exactly as told by your health care provider and adjust them as directed. It is normal to feel mild stretching, pulling, tightness, or discomfort as you do these exercises, but you should stop right away if you feel sudden pain or your pain gets worse. Exercises Single knee to chest Repeat these steps 3-5 times for each leg: 1. Lie on your back on a firm bed or the floor with your legs extended. 2. Bring one knee to your chest. Your other leg should stay extended and in contact with the floor. 3. Hold your knee in place by   grabbing your knee or thigh with both hands and hold. 4. Pull on your knee until you feel a gentle stretch in your lower back or buttocks. 5. Hold the stretch for 10-30 seconds. 6. Slowly release and straighten your leg. Pelvic tilt Repeat these steps 5-10 times: 1. Lie on your back on a firm bed or the floor with your legs extended. 2. Bend your knees so they are pointing toward the ceiling and your feet are flat on the floor. 3. Tighten your lower abdominal muscles to press your lower back against the floor. This motion will tilt your pelvis so your tailbone points up toward the ceiling instead of pointing to your feet or the floor. 4. With gentle tension and even breathing, hold this position for 5-10 seconds. Cat-cow Repeat these steps until your lower back becomes more flexible: 1. Get into a hands-and-knees position on a firm surface. Keep your hands under your shoulders, and  keep your knees under your hips. You may place padding under your knees for comfort. 2. Let your head hang down toward your chest. Contract your abdominal muscles and point your tailbone toward the floor so your lower back becomes rounded like the back of a cat. 3. Hold this position for 5 seconds. 4. Slowly lift your head, let your abdominal muscles relax and point your tailbone up toward the ceiling so your back forms a sagging arch like the back of a cow. 5. Hold this position for 5 seconds.   Press-ups Repeat these steps 5-10 times: 1. Lie on your abdomen (face-down) on the floor. 2. Place your palms near your head, about shoulder-width apart. 3. Keeping your back as relaxed as possible and keeping your hips on the floor, slowly straighten your arms to raise the top half of your body and lift your shoulders. Do not use your back muscles to raise your upper torso. You may adjust the placement of your hands to make yourself more comfortable. 4. Hold this position for 5 seconds while you keep your back relaxed. 5. Slowly return to lying flat on the floor.   Bridges Repeat these steps 10 times: 1. Lie on your back on a firm surface. 2. Bend your knees so they are pointing toward the ceiling and your feet are flat on the floor. Your arms should be flat at your sides, next to your body. 3. Tighten your buttocks muscles and lift your buttocks off the floor until your waist is at almost the same height as your knees. You should feel the muscles working in your buttocks and the back of your thighs. If you do not feel these muscles, slide your feet 1-2 inches farther away from your buttocks. 4. Hold this position for 3-5 seconds. 5. Slowly lower your hips to the starting position, and allow your buttocks muscles to relax completely. If this exercise is too easy, try doing it with your arms crossed over your chest.   Abdominal crunches Repeat these steps 5-10 times: 1. Lie on your back on a firm bed  or the floor with your legs extended. 2. Bend your knees so they are pointing toward the ceiling and your feet are flat on the floor. 3. Cross your arms over your chest. 4. Tip your chin slightly toward your chest without bending your neck. 5. Tighten your abdominal muscles and slowly raise your trunk (torso) high enough to lift your shoulder blades a tiny bit off the floor. Avoid raising your torso higher than that because it can   put too much stress on your low back and does not help to strengthen your abdominal muscles. 6. Slowly return to your starting position. Back lifts Repeat these steps 5-10 times: 1. Lie on your abdomen (face-down) with your arms at your sides, and rest your forehead on the floor. 2. Tighten the muscles in your legs and your buttocks. 3. Slowly lift your chest off the floor while you keep your hips pressed to the floor. Keep the back of your head in line with the curve in your back. Your eyes should be looking at the floor. 4. Hold this position for 3-5 seconds. 5. Slowly return to your starting position. Contact a health care provider if:  Your back pain or discomfort gets much worse when you do an exercise.  Your worsening back pain or discomfort does not lessen within 2 hours after you exercise. If you have any of these problems, stop doing these exercises right away. Do not do them again unless your health care provider says that you can. Get help right away if:  You develop sudden, severe back pain. If this happens, stop doing the exercises right away. Do not do them again unless your health care provider says that you can. This information is not intended to replace advice given to you by your health care provider. Make sure you discuss any questions you have with your health care provider. Document Revised: 05/28/2018 Document Reviewed: 10/23/2017 Elsevier Patient Education  2021 [employer]  

## 2020-04-18 ENCOUNTER — Ambulatory Visit: Payer: BC Managed Care – PPO | Admitting: Rheumatology

## 2020-04-28 ENCOUNTER — Ambulatory Visit (HOSPITAL_COMMUNITY): Payer: BC Managed Care – PPO | Admitting: Physical Therapy

## 2020-05-01 ENCOUNTER — Ambulatory Visit: Payer: BC Managed Care – PPO | Admitting: Orthopedic Surgery
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
## 2020-06-28 ENCOUNTER — Encounter: Payer: Self-pay | Admitting: Rheumatology

## 2020-06-28 ENCOUNTER — Other Ambulatory Visit: Payer: Self-pay

## 2020-06-28 ENCOUNTER — Ambulatory Visit: Payer: BC Managed Care – PPO | Admitting: Rheumatology

## 2020-06-28 VITALS — BP 131/82 | HR 90 | Ht 62.0 in | Wt 160.0 lb

## 2020-06-28 DIAGNOSIS — M7061 Trochanteric bursitis, right hip: Secondary | ICD-10-CM

## 2020-06-28 DIAGNOSIS — R5383 Other fatigue: Secondary | ICD-10-CM

## 2020-06-28 DIAGNOSIS — M19072 Primary osteoarthritis, left ankle and foot: Secondary | ICD-10-CM

## 2020-06-28 DIAGNOSIS — E559 Vitamin D deficiency, unspecified: Secondary | ICD-10-CM

## 2020-06-28 DIAGNOSIS — L659 Nonscarring hair loss, unspecified: Secondary | ICD-10-CM

## 2020-06-28 DIAGNOSIS — N3020 Other chronic cystitis without hematuria: Secondary | ICD-10-CM

## 2020-06-28 DIAGNOSIS — M7062 Trochanteric bursitis, left hip: Secondary | ICD-10-CM

## 2020-06-28 DIAGNOSIS — M25551 Pain in right hip: Secondary | ICD-10-CM

## 2020-06-28 DIAGNOSIS — M5136 Other intervertebral disc degeneration, lumbar region: Secondary | ICD-10-CM

## 2020-06-28 DIAGNOSIS — M25561 Pain in right knee: Secondary | ICD-10-CM

## 2020-06-28 DIAGNOSIS — G8929 Other chronic pain: Secondary | ICD-10-CM

## 2020-06-28 DIAGNOSIS — Z8719 Personal history of other diseases of the digestive system: Secondary | ICD-10-CM

## 2020-06-28 DIAGNOSIS — M7712 Lateral epicondylitis, left elbow: Secondary | ICD-10-CM | POA: Diagnosis not present

## 2020-06-28 DIAGNOSIS — M19071 Primary osteoarthritis, right ankle and foot: Secondary | ICD-10-CM

## 2020-06-28 DIAGNOSIS — M19041 Primary osteoarthritis, right hand: Secondary | ICD-10-CM

## 2020-06-28 DIAGNOSIS — M5134 Other intervertebral disc degeneration, thoracic region: Secondary | ICD-10-CM

## 2020-06-28 DIAGNOSIS — Z8639 Personal history of other endocrine, nutritional and metabolic disease: Secondary | ICD-10-CM

## 2020-06-28 DIAGNOSIS — M19042 Primary osteoarthritis, left hand: Secondary | ICD-10-CM

## 2020-06-28 DIAGNOSIS — E270 Other adrenocortical overactivity: Secondary | ICD-10-CM

## 2020-06-28 NOTE — Patient Instructions (Signed)
Iliotibial Band Syndrome Rehab Ask your health care provider which exercises are safe for you. Do exercises exactly as told by your health care provider and adjust them as directed. It is normal to feel mild stretching, pulling, tightness, or discomfort as you do these exercises. Stop right away if you feel sudden pain or your pain gets significantly worse. Do not begin these exercises until told by your health care provider. Stretching and range-of-motion exercises These exercises warm up your muscles and joints and improve the movement and flexibility of your hip and pelvis. Quadriceps stretch, prone 1. Lie on your abdomen (prone position) on a firm surface, such as a bed or padded floor. 2. Bend your left / right knee and reach back to hold your ankle or pant leg. If you cannot reach your ankle or pant leg, loop a belt around your foot and grab the belt instead. 3. Gently pull your heel toward your buttocks. Your knee should not slide out to the side. You should feel a stretch in the front of your thigh and knee (quadriceps). 4. Hold this position for __________ seconds. Repeat __________ times. Complete this exercise __________ times a day.   Iliotibial band stretch An iliotibial band is a strong band of muscle tissue that runs from the outer side of your hip to the outer side of your thigh and knee. 1. Lie on your side with your left / right leg in the top position. 2. Bend both of your knees and grab your left / right ankle. Stretch out your bottom arm to help you balance. 3. Slowly bring your top knee back so your thigh goes behind your trunk. 4. Slowly lower your top leg toward the floor until you feel a gentle stretch on the outside of your left / right hip and thigh. If you do not feel a stretch and your knee will not fall farther, place the heel of your other foot on top of your knee and pull your knee down toward the floor with your foot. 5. Hold this position for __________  seconds. Repeat __________ times. Complete this exercise __________ times a day.   Strengthening exercises These exercises build strength and endurance in your hip and pelvis. Endurance is the ability to use your muscles for a long time, even after they get tired. Straight leg raises, side-lying This exercise strengthens the muscles that rotate the leg at the hip and move it away from your body (hip abductors). 1. Lie on your side with your left / right leg in the top position. Lie so your head, shoulder, hip, and knee line up. You may bend your bottom knee to help you balance. 2. Roll your hips slightly forward so your hips are stacked directly over each other and your left / right knee is facing forward. 3. Tense the muscles in your outer thigh and lift your top leg 4-6 inches (10-15 cm). 4. Hold this position for __________ seconds. 5. Slowly lower your leg to return to the starting position. Let your muscles relax completely before doing another repetition. Repeat __________ times. Complete this exercise __________ times a day.   Leg raises, prone This exercise strengthens the muscles that move the hips backward (hip extensors). 1. Lie on your abdomen (prone position) on your bed or a firm surface. You can put a pillow under your hips if that is more comfortable for your lower back. 2. Bend your left / right knee so your foot is straight up in the air.   3. Squeeze your buttocks muscles and lift your left / right thigh off the bed. Do not let your back arch. 4. Tense your thigh muscle as hard as you can without increasing any knee pain. 5. Hold this position for __________ seconds. 6. Slowly lower your leg to return to the starting position and allow it to relax completely. Repeat __________ times. Complete this exercise __________ times a day. Hip hike 1. Stand sideways on a bottom step. Stand on your left / right leg with your other foot unsupported next to the step. You can hold on to a  railing or wall for balance if needed. 2. Keep your knees straight and your torso square. Then lift your left / right hip up toward the ceiling. 3. Slowly let your left / right hip lower toward the floor, past the starting position. Your foot should get closer to the floor. Do not lean or bend your knees. Repeat __________ times. Complete this exercise __________ times a day. This information is not intended to replace advice given to you by your health care provider. Make sure you discuss any questions you have with your health care provider. Document Revised: 03/31/2019 Document Reviewed: 03/31/2019 Else[employer] Patient Education  2021 [employer] Elbow and Forearm Exercises Ask your health care provider which exercises are safe for you. Do exercises exactly as told by your health care provider and adjust them as directed. It is normal to feel mild stretching, pulling, tightness, or discomfort as you do these exercises. Stop right away if you feel sudden pain or your pain gets worse. Do not begin these exercises until told by your health care provider. Range-of-motion exercises These exercises warm up your muscles and joints and improve the movement and flexibility of your injured elbow and forearm. The exercises also help to relieve pain, numbness, and tingling. These exercises are done using the muscles in your injured elbow and forearm (active). Elbow flexion, active 1. Hold your left / right arm at your side, and bend your elbow (flexion) as far as you can using only your arm muscles. 2. Hold this position for __________ seconds. 3. Slowly return to the starting position. Repeat __________ times. Complete this exercise __________ times a day. Elbow extension, active 1. Hold your left / right arm at your side, and straighten your elbow (extension) as much as you can using only your arm muscles. 2. Hold this position for __________ seconds. 3. Slowly return to the starting position. Repeat  __________ times. Complete this exercise __________ times a day. Active forearm rotation, supination This is an exercise in which you turn (rotate) your forearm palm up (supination). 1. Stand or sit with your elbows at your sides. 2. Bend your left / right elbow to a 90-degree angle (right angle). 3. Rotate your palm up until you feel a gentle stretch on the inside of your forearm. 4. Hold this position for __________ seconds. 5. Slowly return to the starting position. Repeat __________ times. Complete this exercise __________ times a day. Active forearm rotation, pronation This is an exercise in which you turn (rotate) your forearm palm down (pronation). 1. Stand or sit with your elbows at your sides. 2. Bend your left / right elbow to a 90-degree angle (right angle). 3. Rotate your palm down until you feel a gentle stretch on the top of your forearm. 4. Hold this position for __________ seconds. 5. Slowly return to the starting position. Repeat __________ times. Complete this exercise __________ times a day. Stretching exercises   These exercises warm up your muscles and joints and improve the movement and flexibility of your injured elbow and forearm. These exercises also help to relieve pain, numbness, and tingling. These exercises are done using your healthy elbow and forearm to help stretch the muscles in your injured elbow and forearm (active-assisted). Elbow flexion, active-assisted 1. Hold your left / right arm at your side, and bend your elbow (flexion) as much as you can using your left / right arm muscles. 2. Use your other hand to bend your left / right elbow farther. To do this, gently push up on your forearm until you feel a gentle stretch on the back of your elbow. 3. Hold this position for __________ seconds. 4. Slowly return to the starting position. Repeat __________ times. Complete this exercise __________ times a day.   Elbow extension, active-assisted 1. Hold your left /  right arm at your side, and straighten your elbow (extension) as much as you can using your left / right arm muscles. 2. Use your other hand to straighten the left / right elbow farther. To do this, gently push down on your forearm until you feel a gentle stretch on the inside of your elbow. 3. Hold this position for __________ seconds. 4. Slowly return to the starting position. Repeat __________ times. Complete this exercise __________ times a day.   Active-assisted forearm rotation, supination This is an exercise in which you turn (rotate) your forearm palm up (supination). 1. Sit with your left / right elbow bent in a 90-degree angle (right angle) with your forearm resting on a table. 2. Keeping your upper body and shoulder still, rotate your forearm so your palm faces upward. 3. Use your other hand to help rotate your forearm further until you feel a gentle to moderate stretch. 4. Hold this position for __________ seconds. 5. Slowly release the stretch and return to the starting position. Repeat __________ times. Complete this exercise __________ times a day. Active-assisted forearm rotation, pronation This is an exercise in which you turn (rotate) your forearm palm down (pronation). 1. Sit with your left / right elbow bent in a 90-degree angle (right angle) with your forearm resting on a table. 2. Keeping your upper body and shoulder still, rotate your forearm so your palm faces the tabletop. 3. Use your other hand to help rotate your forearm further until you feel a gentle to moderate stretch. 4. Hold this position for __________ seconds. 5. Slowly release the stretch and return to the starting position. Repeat __________ times. Complete this exercise __________ times a day.   Passive elbow flexion, supine 1. Lie on your back (supine position). 2. Extend your left / right arm up in the air, bracing it with your other hand. 3. Let your left / right hand slowly lower toward your shoulder  (passive flexion), while your elbow stays pointed toward the ceiling. You should feel a gentle stretch along the back of your upper arm and elbow. 4. If instructed by your health care provider, you may increase the intensity of your stretch by adding a small wrist weight or hand weight. 5. Hold this position for __________ seconds. 6. Slowly return to the starting position. Repeat __________ times. Complete this exercise __________ times a day. Passive elbow extension, supine 1. Lie on your back (supine position). Make sure that you are in a comfortable position that lets you relax your arm muscles. 2. Place a folded towel under your left / right upper arm so your elbow and   shoulder are at the same height. Straighten your left / right arm so your elbow does not rest on the bed or towel. 3. Let the weight of your hand stretch your elbow (passive extension). Keep your arm and chest muscles relaxed. You should feel a stretch on the inside of your elbow. 4. If told by your health care provider, you may increase the intensity of your stretch by adding a small wrist weight or hand weight. 5. Hold this position for __________ seconds. 6. Slowly release the stretch. Repeat __________ times. Complete this exercise __________ times a day.   Strengthening exercises These exercises build strength and endurance in your elbow and forearm. Endurance is the ability to use your muscles for a long time, even after they get tired. Elbow flexion, isometric 1. Stand or sit up straight. 2. Bend your left / right elbow in a 90-degree angle (right angle), and keep your forearm at the height of your waist. Your thumb should be pointed toward the ceiling (neutral forearm). 3. Place your other hand on top of your left / right forearm. Gently push down while you resist with your left / right arm (isometric flexion). Push as hard as you can with both arms without causing any pain or movement at your left / right  elbow. 4. Hold this position for __________ seconds. 5. Slowly release the tension in both arms. Let your muscles relax completely before you repeat the exercise. Repeat __________ times. Complete this exercise __________ times a day.   Elbow extension, isometric 1. Stand or sit up straight. 2. Place your left / right arm so your palm faces your abdomen and is at the height of your waist. 3. Place your other hand on the underside of your left / right forearm. Gently push up while you resist with your left / right arm (isometric extension). Push as hard as you can with both arms without causing any pain or movement at your left / right elbow. 4. Hold this position for __________ seconds. 5. Slowly release the tension in both arms. Let your muscles relax completely before you repeat the exercise. Repeat __________ times. Complete this exercise __________ times a day.   Elbow flexion with forearm palm up 1. Sit on a firm chair without armrests, or stand up. 2. Place your left / right arm at your side with your elbow straight and your palm facing forward. 3. Holding a __________weight or gripping a rubber exercise band or tubing, bend your elbow to bring your hand toward your shoulder (flexion). 4. Hold this position for __________ seconds. 5. Slowly return to the starting position. Repeat __________ times. Complete this exercise __________ times a day.   Elbow extension, active 1. Sit on a firm chair without armrests, or stand up. 2. Hold a rubber exercise band or tubing in both hands. 3. Keeping your upper arms at your sides, bring both hands up to your left / right shoulder. Keep your left / right hand just below your other hand. 4. Straighten your left / right elbow (extension) while keeping your other arm still. 5. Hold this position for __________ seconds. 6. Control the resistance of the band or tubing as you return to the starting position. Repeat __________ times. Complete this exercise  __________ times a day.   Forearm rotation, supination 1. Sit with your left / right forearm supported on a table. Your elbow should be at waist height and bent at a 90-degree angle (right angle). 2. Gently grasp a lightweight   hammer. 3. Rest your hand over the edge of the table with your palm facing down. 4. Without moving your left / right elbow, slowly rotate your forearm to turn your palm up toward the ceiling (supination). 5. Hold this position for __________ seconds. 6. Slowly return to the starting position. Repeat __________ times. Complete this exercise __________ times a day.   Forearm rotation, pronation 1. Sit with your left / right forearm supported on a table. Keep your elbow below shoulder height. 2. Gently grasp a lightweight hammer. 3. Rest your hand over the edge of the table with your palm facing up. 4. Without moving your left / right elbow, slowly rotate your forearm to turn your palm down toward the floor (pronation). 5. Hold this position for __________seconds. 6. Slowly return to the starting position. Repeat __________ times. Complete this exercise __________ times a day.   This information is not intended to replace advice given to you by your health care provider. Make sure you discuss any questions you have with your health care provider. Document Revised: 05/14/2018 Document Reviewed: 02/11/2018 Elsevier Patient Education  2021 [employer]  

## 2020-06-29 ENCOUNTER — Other Ambulatory Visit: Payer: Self-pay | Admitting: *Deleted

## 2020-06-29 DIAGNOSIS — E559 Vitamin D deficiency, unspecified: Secondary | ICD-10-CM

## 2020-06-29 LAB — COMPLETE METABOLIC PANEL WITH GFR
AG Ratio: 1.7 (calc) (ref 1.0–2.5)
ALT: 19 U/L (ref 6–29)
AST: 20 U/L (ref 10–35)
Albumin: 4.3 g/dL (ref 3.6–5.1)
Alkaline phosphatase (APISO): 59 U/L (ref 37–153)
BUN: 19 mg/dL (ref 7–25)
CO2: 22 mmol/L (ref 20–32)
Calcium: 9.6 mg/dL (ref 8.6–10.4)
Chloride: 107 mmol/L (ref 98–110)
Creat: 1 mg/dL (ref 0.50–1.05)
GFR, Est African American: 75 mL/min/{1.73_m2} (ref >=60)
GFR, Est Non African American: 65 mL/min/{1.73_m2} (ref >=60)
Globulin: 2.5 g/dL (calc) (ref 1.9–3.7)
Glucose, Bld: 96 mg/dL (ref 65–99)
Potassium: 3.7 mmol/L (ref 3.5–5.3)
Sodium: 140 mmol/L (ref 135–146)
Total Bilirubin: 0.3 mg/dL (ref 0.2–1.2)
Total Protein: 6.8 g/dL (ref 6.1–8.1)

## 2020-06-29 LAB — CBC WITH DIFFERENTIAL/PLATELET
Absolute Monocytes: 561 cells/uL (ref 200–950)
Basophils Absolute: 59 cells/uL (ref 0–200)
Basophils Relative: 0.9 %
Eosinophils Absolute: 53 cells/uL (ref 15–500)
Eosinophils Relative: 0.8 %
HCT: 41.3 % (ref 35.0–45.0)
Hemoglobin: 13.8 g/dL (ref 11.7–15.5)
Lymphs Abs: 1914 cells/uL (ref 850–3900)
MCH: 31.2 pg (ref 27.0–33.0)
MCHC: 33.4 g/dL (ref 32.0–36.0)
MCV: 93.4 fL (ref 80.0–100.0)
MPV: 11.1 fL (ref 7.5–12.5)
Monocytes Relative: 8.5 %
Neutro Abs: 4013 cells/uL (ref 1500–7800)
Neutrophils Relative %: 60.8 %
Platelets: 211 10*3/uL (ref 140–400)
RBC: 4.42 10*6/uL (ref 3.80–5.10)
RDW: 13.2 % (ref 11.0–15.0)
Total Lymphocyte: 29 %
WBC: 6.6 10*3/uL (ref 3.8–10.8)

## 2020-06-29 LAB — ANA: Anti Nuclear Antibody (ANA): NEGATIVE

## 2020-06-29 LAB — SEDIMENTATION RATE: Sed Rate: 6 mm/h (ref 0–30)

## 2020-06-29 LAB — VITAMIN D 25 HYDROXY (VIT D DEFICIENCY, FRACTURES): Vit D, 25-Hydroxy: 25 ng/mL — ABNORMAL LOW (ref 30–100)

## 2020-06-29 LAB — TSH: TSH: 1.78 mIU/L

## 2020-06-29 MED ORDER — VITAMIN D (ERGOCALCIFEROL) 1.25 MG (50000 UNIT) PO CAPS: 50000.0000 [IU] | ORAL_CAPSULE | ORAL | 0 refills | Status: AC
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
## 2020-12-18 ENCOUNTER — Other Ambulatory Visit: Payer: Self-pay

## 2020-12-18 ENCOUNTER — Ambulatory Visit: Payer: BC Managed Care – PPO

## 2020-12-18 ENCOUNTER — Encounter: Payer: Self-pay | Admitting: Orthopedic Surgery

## 2020-12-18 ENCOUNTER — Ambulatory Visit (INDEPENDENT_AMBULATORY_CARE_PROVIDER_SITE_OTHER): Payer: BC Managed Care – PPO | Admitting: Orthopedic Surgery

## 2020-12-18 DIAGNOSIS — G8929 Other chronic pain: Secondary | ICD-10-CM

## 2020-12-18 DIAGNOSIS — M79672 Pain in left foot: Secondary | ICD-10-CM | POA: Diagnosis not present

## 2020-12-18 DIAGNOSIS — M79671 Pain in right foot: Secondary | ICD-10-CM

## 2020-12-18 NOTE — Progress Notes (Deleted)
q 

## 2020-12-18 NOTE — Patient Instructions (Addendum)
Continue the gabapentin   Talk to rheumatology about prednisone management

## 2020-12-18 NOTE — Addendum Note (Signed)
Addended byCaffie Damme on: 12/18/2020 03:36 PM   Modules accepted: Orders

## 2020-12-20 ENCOUNTER — Ambulatory Visit: Payer: BC Managed Care – PPO | Admitting: Rheumatology

## 2020-12-20 DIAGNOSIS — E559 Vitamin D deficiency, unspecified: Secondary | ICD-10-CM

## 2020-12-20 DIAGNOSIS — Z8719 Personal history of other diseases of the digestive system: Secondary | ICD-10-CM

## 2020-12-20 DIAGNOSIS — M5136 Other intervertebral disc degeneration, lumbar region: Secondary | ICD-10-CM

## 2020-12-20 DIAGNOSIS — M7061 Trochanteric bursitis, right hip: Secondary | ICD-10-CM

## 2020-12-20 DIAGNOSIS — M19042 Primary osteoarthritis, left hand: Secondary | ICD-10-CM

## 2020-12-20 DIAGNOSIS — M7712 Lateral epicondylitis, left elbow: Secondary | ICD-10-CM

## 2020-12-20 DIAGNOSIS — L659 Nonscarring hair loss, unspecified: Secondary | ICD-10-CM

## 2020-12-20 DIAGNOSIS — M5134 Other intervertebral disc degeneration, thoracic region: Secondary | ICD-10-CM

## 2020-12-20 DIAGNOSIS — H9202 Otalgia, left ear: Secondary | ICD-10-CM

## 2020-12-20 DIAGNOSIS — Z8639 Personal history of other endocrine, nutritional and metabolic disease: Secondary | ICD-10-CM

## 2020-12-20 DIAGNOSIS — G8929 Other chronic pain: Secondary | ICD-10-CM

## 2020-12-20 DIAGNOSIS — E270 Other adrenocortical overactivity: Secondary | ICD-10-CM

## 2020-12-20 DIAGNOSIS — M25551 Pain in right hip: Secondary | ICD-10-CM

## 2020-12-20 DIAGNOSIS — Q667 Congenital pes cavus, unspecified foot: Secondary | ICD-10-CM

## 2020-12-20 DIAGNOSIS — M19071 Primary osteoarthritis, right ankle and foot: Secondary | ICD-10-CM

## 2020-12-20 DIAGNOSIS — N3020 Other chronic cystitis without hematuria: Secondary | ICD-10-CM

## 2021-01-01 NOTE — Addendum Note (Signed)
Addended by: Fuller Canada E on: 01/01/2021 12:10 PM   Modules accepted: Level of Service
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
## 2021-01-08 ENCOUNTER — Other Ambulatory Visit: Payer: Self-pay

## 2021-01-08 ENCOUNTER — Encounter: Payer: Self-pay | Admitting: Rheumatology

## 2021-01-08 ENCOUNTER — Ambulatory Visit: Payer: BC Managed Care – PPO | Admitting: Rheumatology

## 2021-01-08 VITALS — BP 143/92 | HR 90 | Ht 62.0 in | Wt 163.8 lb

## 2021-01-08 DIAGNOSIS — M19071 Primary osteoarthritis, right ankle and foot: Secondary | ICD-10-CM

## 2021-01-08 DIAGNOSIS — M19042 Primary osteoarthritis, left hand: Secondary | ICD-10-CM

## 2021-01-08 DIAGNOSIS — E559 Vitamin D deficiency, unspecified: Secondary | ICD-10-CM

## 2021-01-08 DIAGNOSIS — M7712 Lateral epicondylitis, left elbow: Secondary | ICD-10-CM

## 2021-01-08 DIAGNOSIS — M19041 Primary osteoarthritis, right hand: Secondary | ICD-10-CM | POA: Diagnosis not present

## 2021-01-08 DIAGNOSIS — G8929 Other chronic pain: Secondary | ICD-10-CM

## 2021-01-08 DIAGNOSIS — M25561 Pain in right knee: Secondary | ICD-10-CM | POA: Diagnosis not present

## 2021-01-08 DIAGNOSIS — M19072 Primary osteoarthritis, left ankle and foot: Secondary | ICD-10-CM

## 2021-01-08 DIAGNOSIS — L659 Nonscarring hair loss, unspecified: Secondary | ICD-10-CM

## 2021-01-08 DIAGNOSIS — M7062 Trochanteric bursitis, left hip: Secondary | ICD-10-CM

## 2021-01-08 DIAGNOSIS — M7061 Trochanteric bursitis, right hip: Secondary | ICD-10-CM

## 2021-01-08 DIAGNOSIS — N3020 Other chronic cystitis without hematuria: Secondary | ICD-10-CM

## 2021-01-08 DIAGNOSIS — M5134 Other intervertebral disc degeneration, thoracic region: Secondary | ICD-10-CM

## 2021-01-08 DIAGNOSIS — M25551 Pain in right hip: Secondary | ICD-10-CM | POA: Diagnosis not present

## 2021-01-08 DIAGNOSIS — Z8719 Personal history of other diseases of the digestive system: Secondary | ICD-10-CM

## 2021-01-08 DIAGNOSIS — R5383 Other fatigue: Secondary | ICD-10-CM

## 2021-01-08 DIAGNOSIS — M5136 Other intervertebral disc degeneration, lumbar region: Secondary | ICD-10-CM

## 2021-01-08 DIAGNOSIS — Z8639 Personal history of other endocrine, nutritional and metabolic disease: Secondary | ICD-10-CM

## 2021-01-08 NOTE — Patient Instructions (Addendum)
Back Exercises The following exercises strengthen the muscles that help to support the trunk (torso) and back. They also help to keep the lower back flexible. Doing these exercises can help to prevent or lessen existing low back pain. If you have back pain or discomfort, try doing these exercises 2-3 times each day or as told by your health care provider. As your pain improves, do them once each day, but increase the number of times that you repeat the steps for each exercise (do more repetitions). To prevent the recurrence of back pain, continue to do these exercises once each day or as told by your health care provider. Do exercises exactly as told by your health care provider and adjust them as directed. It is normal to feel mild stretching, pulling, tightness, or discomfort as you do these exercises, but you should stop right away if you feel sudden pain or your pain gets worse. Exercises Single knee to chest Repeat these steps 3-5 times for each leg: Lie on your back on a firm bed or the floor with your legs extended. Bring one knee to your chest. Your other leg should stay extended and in contact with the floor. Hold your knee in place by grabbing your knee or thigh with both hands and hold. Pull on your knee until you feel a gentle stretch in your lower back or buttocks. Hold the stretch for 10-30 seconds. Slowly release and straighten your leg.  Pelvic tilt Repeat these steps 5-10 times: Lie on your back on a firm bed or the floor with your legs extended. Bend your knees so they are pointing toward the ceiling and your feet are flat on the floor. Tighten your lower abdominal muscles to press your lower back against the floor. This motion will tilt your pelvis so your tailbone points up toward the ceiling instead of pointing to your feet or the floor. With gentle tension and even breathing, hold this position for 5-10 seconds.  Cat-cow Repeat these steps until your lower back becomes  more flexible: Get into a hands-and-knees position on a firm bed or the floor. Keep your hands under your shoulders, and keep your knees under your hips. You may place padding under your knees for comfort. Let your head hang down toward your chest. Contract your abdominal muscles and point your tailbone toward the floor so your lower back becomes rounded like the back of a cat. Hold this position for 5 seconds. Slowly lift your head, let your abdominal muscles relax, and point your tailbone up toward the ceiling so your back forms a sagging arch like the back of a cow. Hold this position for 5 seconds.  Press-ups Repeat these steps 5-10 times: Lie on your abdomen (face-down) on a firm bed or the floor. Place your palms near your head, about shoulder-width apart. Keeping your back as relaxed as possible and keeping your hips on the floor, slowly straighten your arms to raise the top half of your body and lift your shoulders. Do not use your back muscles to raise your upper torso. You may adjust the placement of your hands to make yourself more comfortable. Hold this position for 5 seconds while you keep your back relaxed. Slowly return to lying flat on the floor.  Bridges Repeat these steps 10 times: Lie on your back on a firm bed or the floor. Bend your knees so they are pointing toward the ceiling and your feet are flat on the floor. Your arms should be flat   at your sides, next to your body. Tighten your buttocks muscles and lift your buttocks off the floor until your waist is at almost the same height as your knees. You should feel the muscles working in your buttocks and the back of your thighs. If you do not feel these muscles, slide your feet 1-2 inches (2.5-5 cm) farther away from your buttocks. Hold this position for 3-5 seconds. Slowly lower your hips to the starting position, and allow your buttocks muscles to relax completely. If this exercise is too easy, try doing it with your arms  crossed over your chest. Abdominal crunches Repeat these steps 5-10 times: Lie on your back on a firm bed or the floor with your legs extended. Bend your knees so they are pointing toward the ceiling and your feet are flat on the floor. Cross your arms over your chest. Tip your chin slightly toward your chest without bending your neck. Tighten your abdominal muscles and slowly raise your torso high enough to lift your shoulder blades a tiny bit off the floor. Avoid raising your torso higher than that because it can put too much stress on your lower back and does not help to strengthen your abdominal muscles. Slowly return to your starting position.  Back lifts Repeat these steps 5-10 times: Lie on your abdomen (face-down) with your arms at your sides, and rest your forehead on the floor. Tighten the muscles in your legs and your buttocks. Slowly lift your chest off the floor while you keep your hips pressed to the floor. Keep the back of your head in line with the curve in your back. Your eyes should be looking at the floor. Hold this position for 3-5 seconds. Slowly return to your starting position.  Contact a health care provider if: Your back pain or discomfort gets much worse when you do an exercise. Your worsening back pain or discomfort does not lessen within 2 hours after you exercise. If you have any of these problems, stop doing these exercises right away. Do not do them again unless your health care provider says that you can. Get help right away if: You develop sudden, severe back pain. If this happens, stop doing the exercises right away. Do not do them again unless your health care provider says that you can. This information is not intended to replace advice given to you by your health care provider. Make sure you discuss any questions you have with your health care provider. Document Revised: 07/18/2020 Document Reviewed: 04/05/2020 [employer] Patient Education  2022 [employer]  Hand Exercises Hand exercises can be helpful for almost anyone. These exercises can strengthen the hands, improve flexibility and movement, and increase blood flow to the hands. These results can make work and daily tasks easier. Hand exercises can be especially helpful for people who have joint pain from arthritis or have nerve damage from overuse (carpal tunnel syndrome). These exercises can also help people who have injured a hand. Exercises Most of these hand exercises are gentle stretching and motion exercises. It is usually safe to do them often throughout the day. Warming up your hands before exercise may help to reduce stiffness. You can do this with gentle massage or by placing your hands in warm water for 10-15 minutes. It is normal to feel some stretching, pulling, tightness, or mild discomfort as you begin new exercises. This will gradually improve. Stop an exercise right away if you feel sudden, severe pain or your pain gets worse. Ask  your health care provider which exercises are best for you. Knuckle bend or "claw" fist  Stand or sit with your arm, hand, and all five fingers pointed straight up. Make sure to keep your wrist straight during the exercise. Gently bend your fingers down toward your palm until the tips of your fingers are touching the top of your palm. Keep your big knuckle straight and just bend the small knuckles in your fingers. Hold this position for __________ seconds. Straighten (extend) your fingers back to the starting position. Repeat this exercise 5-10 times with each hand. Full finger fist  Stand or sit with your arm, hand, and all five fingers pointed straight up. Make sure to keep your wrist straight during the exercise. Gently bend your fingers into your palm until the tips of your fingers are touching the middle of your palm. Hold this position for __________ seconds. Extend your fingers back to the starting position, stretching every joint  fully. Repeat this exercise 5-10 times with each hand. Straight fist Stand or sit with your arm, hand, and all five fingers pointed straight up. Make sure to keep your wrist straight during the exercise. Gently bend your fingers at the big knuckle, where your fingers meet your hand, and the middle knuckle. Keep the knuckle at the tips of your fingers straight and try to touch the bottom of your palm. Hold this position for __________ seconds. Extend your fingers back to the starting position, stretching every joint fully. Repeat this exercise 5-10 times with each hand. Tabletop  Stand or sit with your arm, hand, and all five fingers pointed straight up. Make sure to keep your wrist straight during the exercise. Gently bend your fingers at the big knuckle, where your fingers meet your hand, as far down as you can while keeping the small knuckles in your fingers straight. Think of forming a tabletop with your fingers. Hold this position for __________ seconds. Extend your fingers back to the starting position, stretching every joint fully. Repeat this exercise 5-10 times with each hand. Finger spread  Place your hand flat on a table with your palm facing down. Make sure your wrist stays straight as you do this exercise. Spread your fingers and thumb apart from each other as far as you can until you feel a gentle stretch. Hold this position for __________ seconds. Bring your fingers and thumb tight together again. Hold this position for __________ seconds. Repeat this exercise 5-10 times with each hand. Making circles  Stand or sit with your arm, hand, and all five fingers pointed straight up. Make sure to keep your wrist straight during the exercise. Make a circle by touching the tip of your thumb to the tip of your index finger. Hold for __________ seconds. Then open your hand wide. Repeat this motion with your thumb and each finger on your hand. Repeat this exercise 5-10 times with each  hand. Thumb motion  Sit with your forearm resting on a table and your wrist straight. Your thumb should be facing up toward the ceiling. Keep your fingers relaxed as you move your thumb. Lift your thumb up as high as you can toward the ceiling. Hold for __________ seconds. Bend your thumb across your palm as far as you can, reaching the tip of your thumb for the small finger (pinkie) side of your palm. Hold for __________ seconds. Repeat this exercise 5-10 times with each hand. Grip strengthening  Hold a stress ball or other soft ball in the middle   of your hand. Slowly increase the pressure, squeezing the ball as much as you can without causing pain. Think of bringing the tips of your fingers into the middle of your palm. All of your finger joints should bend when doing this exercise. Hold your squeeze for __________ seconds, then relax. Repeat this exercise 5-10 times with each hand. Contact a health care provider if: Your hand pain or discomfort gets much worse when you do an exercise. Your hand pain or discomfort does not improve within 2 hours after you exercise. If you have any of these problems, stop doing these exercises right away. Do not do them again unless your health care provider says that you can. Get help right away if: You develop sudden, severe hand pain or swelling. If this happens, stop doing these exercises right away. Do not do them again unless your health care provider says that you can. This information is not intended to replace advice given to you by your health care provider. Make sure you discuss any questions you have with your health care provider. Document Revised: 05/11/2020 Document Reviewed: 05/11/2020 Else[employer] Patient Education  2022 [employer] Back Exercises The following exercises strengthen the muscles that help to support the trunk (torso) and back. They also help to keep the lower back flexible. Doing these exercises can help to prevent or lessen  existing low back pain. If you have back pain or discomfort, try doing these exercises 2-3 times each day or as told by your health care provider. As your pain improves, do them once each day, but increase the number of times that you repeat the steps for each exercise (do more repetitions). To prevent the recurrence of back pain, continue to do these exercises once each day or as told by your health care provider. Do exercises exactly as told by your health care provider and adjust them as directed. It is normal to feel mild stretching, pulling, tightness, or discomfort as you do these exercises, but you should stop right away if you feel sudden pain or your pain gets worse. Exercises Single knee to chest Repeat these steps 3-5 times for each leg: Lie on your back on a firm bed or the floor with your legs extended. Bring one knee to your chest. Your other leg should stay extended and in contact with the floor. Hold your knee in place by grabbing your knee or thigh with both hands and hold. Pull on your knee until you feel a gentle stretch in your lower back or buttocks. Hold the stretch for 10-30 seconds. Slowly release and straighten your leg.  Pelvic tilt Repeat these steps 5-10 times: Lie on your back on a firm bed or the floor with your legs extended. Bend your knees so they are pointing toward the ceiling and your feet are flat on the floor. Tighten your lower abdominal muscles to press your lower back against the floor. This motion will tilt your pelvis so your tailbone points up toward the ceiling instead of pointing to your feet or the floor. With gentle tension and even breathing, hold this position for 5-10 seconds.  Cat-cow Repeat these steps until your lower back becomes more flexible: Get into a hands-and-knees position on a firm bed or the floor. Keep your hands under your shoulders, and keep your knees under your hips. You may place padding under your knees for comfort. Let  your head hang down toward your chest. Contract your abdominal muscles and point your tailbone toward the  floor so your lower back becomes rounded like the back of a cat. Hold this position for 5 seconds. Slowly lift your head, let your abdominal muscles relax, and point your tailbone up toward the ceiling so your back forms a sagging arch like the back of a cow. Hold this position for 5 seconds.  Press-ups Repeat these steps 5-10 times: Lie on your abdomen (face-down) on a firm bed or the floor. Place your palms near your head, about shoulder-width apart. Keeping your back as relaxed as possible and keeping your hips on the floor, slowly straighten your arms to raise the top half of your body and lift your shoulders. Do not use your back muscles to raise your upper torso. You may adjust the placement of your hands to make yourself more comfortable. Hold this position for 5 seconds while you keep your back relaxed. Slowly return to lying flat on the floor.  Bridges Repeat these steps 10 times: Lie on your back on a firm bed or the floor. Bend your knees so they are pointing toward the ceiling and your feet are flat on the floor. Your arms should be flat at your sides, next to your body. Tighten your buttocks muscles and lift your buttocks off the floor until your waist is at almost the same height as your knees. You should feel the muscles working in your buttocks and the back of your thighs. If you do not feel these muscles, slide your feet 1-2 inches (2.5-5 cm) farther away from your buttocks. Hold this position for 3-5 seconds. Slowly lower your hips to the starting position, and allow your buttocks muscles to relax completely. If this exercise is too easy, try doing it with your arms crossed over your chest. Abdominal crunches Repeat these steps 5-10 times: Lie on your back on a firm bed or the floor with your legs extended. Bend your knees so they are pointing toward the ceiling and your  feet are flat on the floor. Cross your arms over your chest. Tip your chin slightly toward your chest without bending your neck. Tighten your abdominal muscles and slowly raise your torso high enough to lift your shoulder blades a tiny bit off the floor. Avoid raising your torso higher than that because it can put too much stress on your lower back and does not help to strengthen your abdominal muscles. Slowly return to your starting position.  Back lifts Repeat these steps 5-10 times: Lie on your abdomen (face-down) with your arms at your sides, and rest your forehead on the floor. Tighten the muscles in your legs and your buttocks. Slowly lift your chest off the floor while you keep your hips pressed to the floor. Keep the back of your head in line with the curve in your back. Your eyes should be looking at the floor. Hold this position for 3-5 seconds. Slowly return to your starting position.  Contact a health care provider if: Your back pain or discomfort gets much worse when you do an exercise. Your worsening back pain or discomfort does not lessen within 2 hours after you exercise. If you have any of these problems, stop doing these exercises right away. Do not do them again unless your health care provider says that you can. Get help right away if: You develop sudden, severe back pain. If this happens, stop doing the exercises right away. Do not do them again unless your health care provider says that you can. This information is not intended to  replace advice given to you by your health care provider. Make sure you discuss any questions you have with your health care provider. Document Revised: 07/18/2020 Document Reviewed: 04/05/2020 Elsevier Patient Education  2022 [employer]  Please take vitamin D 2000 units daily for vitamin D deficiency.

## 2021-01-25 ENCOUNTER — Ambulatory Visit (HOSPITAL_COMMUNITY): Payer: BC Managed Care – PPO | Admitting: Physical Therapy

## 2021-02-20 ENCOUNTER — Telehealth: Payer: Self-pay | Admitting: Rheumatology

## 2021-02-20 NOTE — Telephone Encounter (Signed)
Referral changed and faxed, pending appt °

## 2021-02-20 NOTE — Telephone Encounter (Signed)
Patient called the office stating she was referred to [facility] for physical therapy and would like a referral to  Fax [phone]. Patient didn't know the name of the facility, she knows the provider. Dyanne Iha, PT.
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
## 2021-04-10 ENCOUNTER — Ambulatory Visit: Payer: BC Managed Care – PPO | Admitting: Physician Assistant

## 2021-04-10 DIAGNOSIS — R5383 Other fatigue: Secondary | ICD-10-CM

## 2021-04-10 DIAGNOSIS — N3020 Other chronic cystitis without hematuria: Secondary | ICD-10-CM

## 2021-04-10 DIAGNOSIS — M25551 Pain in right hip: Secondary | ICD-10-CM

## 2021-04-10 DIAGNOSIS — M5136 Other intervertebral disc degeneration, lumbar region: Secondary | ICD-10-CM

## 2021-04-10 DIAGNOSIS — M19041 Primary osteoarthritis, right hand: Secondary | ICD-10-CM

## 2021-04-10 DIAGNOSIS — Z8639 Personal history of other endocrine, nutritional and metabolic disease: Secondary | ICD-10-CM

## 2021-04-10 DIAGNOSIS — M5134 Other intervertebral disc degeneration, thoracic region: Secondary | ICD-10-CM

## 2021-04-10 DIAGNOSIS — L659 Nonscarring hair loss, unspecified: Secondary | ICD-10-CM

## 2021-04-10 DIAGNOSIS — M7061 Trochanteric bursitis, right hip: Secondary | ICD-10-CM

## 2021-04-10 DIAGNOSIS — M19071 Primary osteoarthritis, right ankle and foot: Secondary | ICD-10-CM

## 2021-04-10 DIAGNOSIS — G8929 Other chronic pain: Secondary | ICD-10-CM

## 2021-04-10 DIAGNOSIS — E559 Vitamin D deficiency, unspecified: Secondary | ICD-10-CM

## 2021-04-10 DIAGNOSIS — Z8719 Personal history of other diseases of the digestive system: Secondary | ICD-10-CM

## 2021-04-10 DIAGNOSIS — M7712 Lateral epicondylitis, left elbow: Secondary | ICD-10-CM

## 2021-11-01 ENCOUNTER — Other Ambulatory Visit (HOSPITAL_COMMUNITY): Payer: Self-pay | Admitting: Internal Medicine

## 2021-11-01 ENCOUNTER — Ambulatory Visit (HOSPITAL_COMMUNITY)
Admission: RE | Admit: 2021-11-01 | Discharge: 2021-11-01 | Disposition: A | Discharge status: Home / Self Care | Payer: BC Managed Care – PPO | Source: Ambulatory Visit | Attending: Internal Medicine | Admitting: Internal Medicine

## 2021-11-01 DIAGNOSIS — R053 Chronic cough: Secondary | ICD-10-CM | POA: Insufficient documentation

## 2022-04-04 ENCOUNTER — Encounter: Payer: Self-pay | Admitting: Radiology

## 2022-05-26 IMAGING — US US EXTREM LOW VENOUS*R*
1 series · 13 of 24 positions shown · non-contrast
Comparison: None.

CLINICAL DATA: 53-year-old female with right leg pain



[Series 1: us extrem low venous*right* · 13 of 38 slices shown]
[im 1/38]
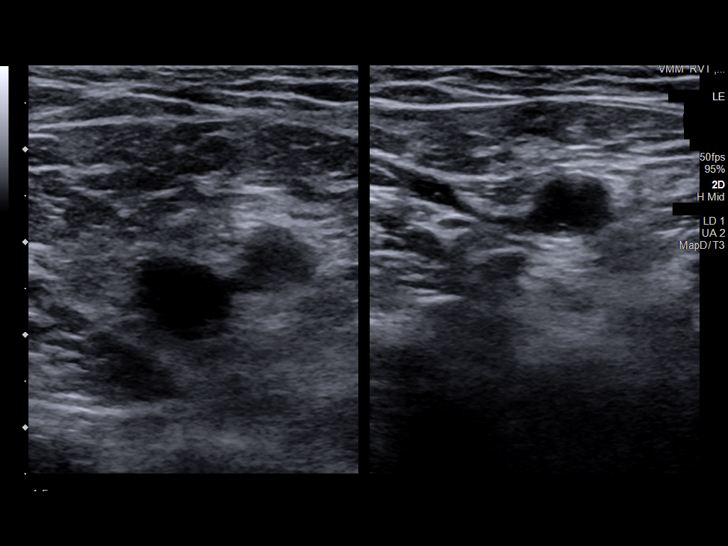
[im 4/38]
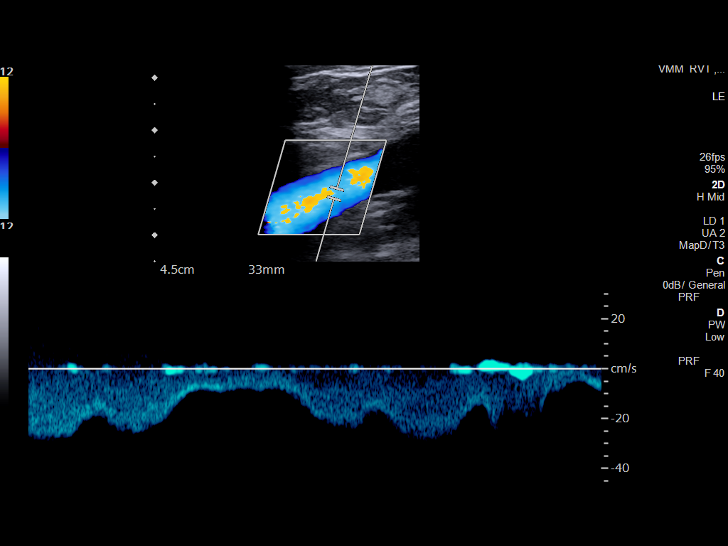
[im 7/38]
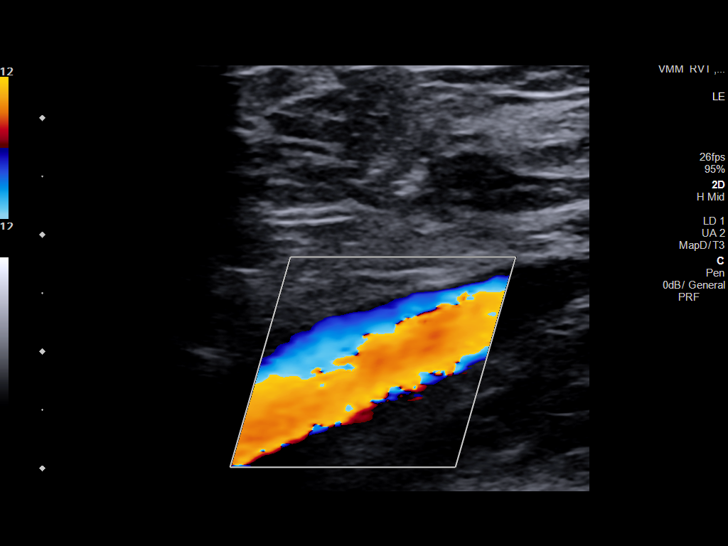
[im 10/38]
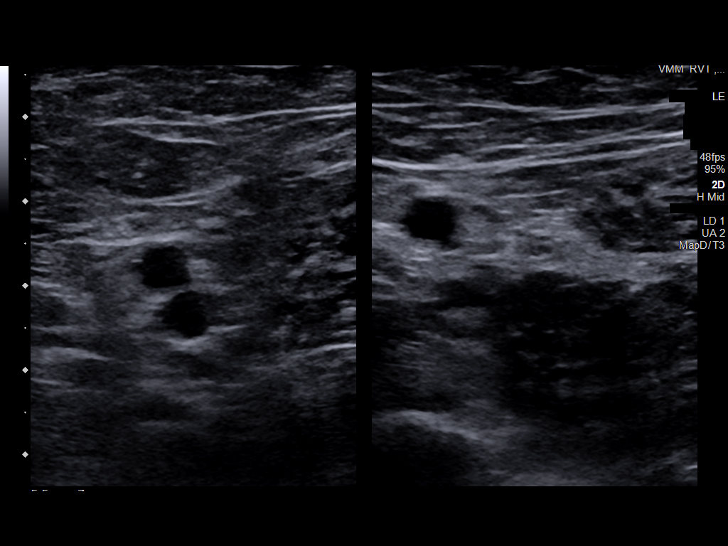
[im 13/38]
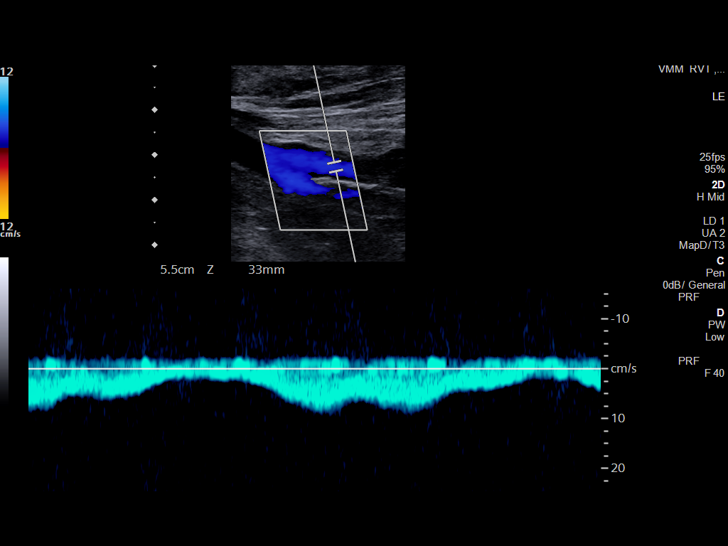
[im 17/38]
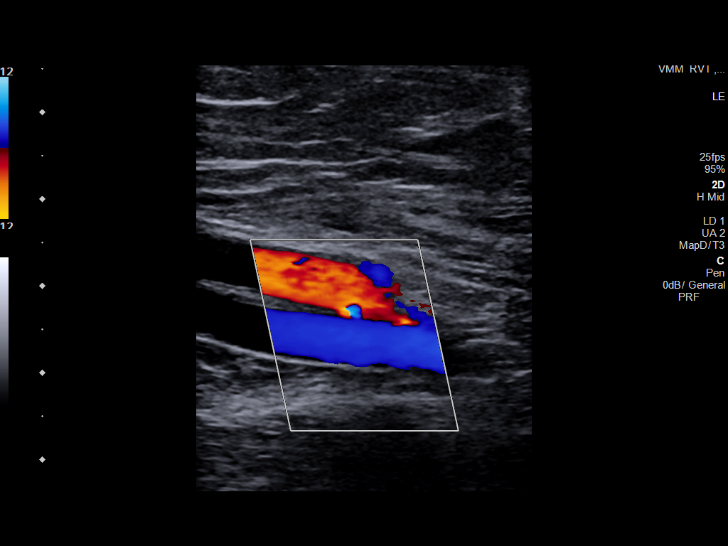
[im 20/38]
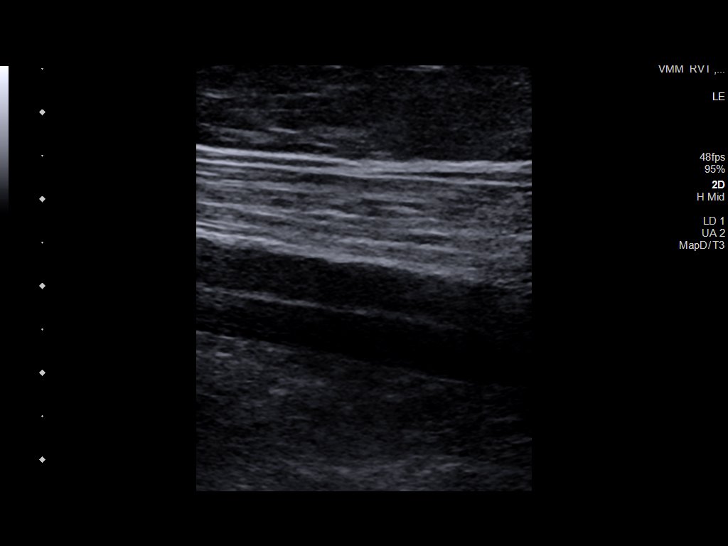
[im 21/38]
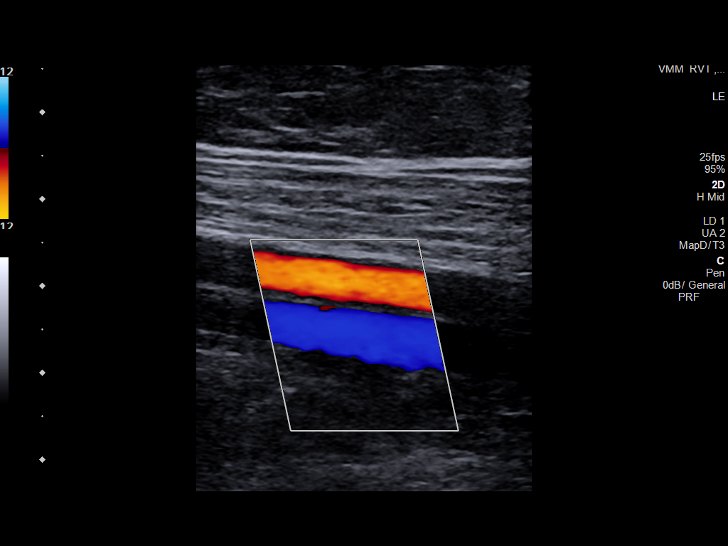
[im 25/38]
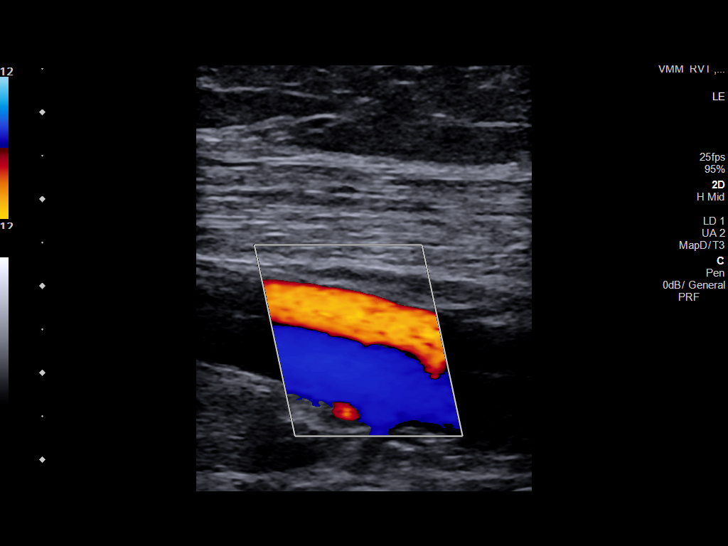
[im 28/38]
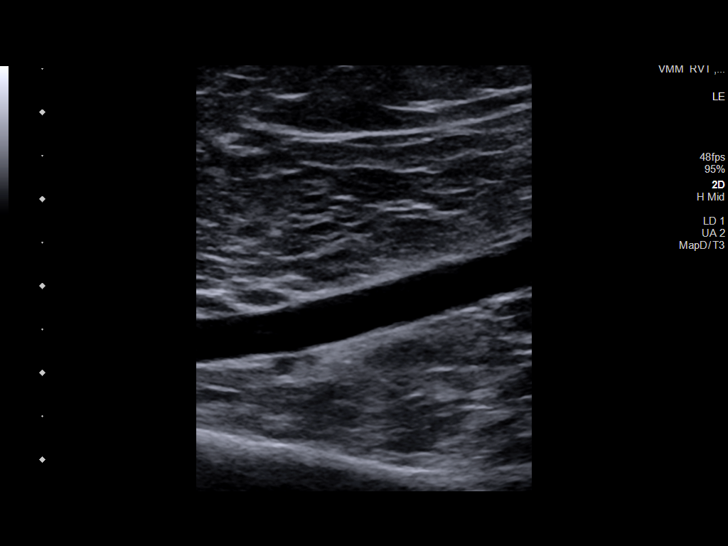
[im 31/38]
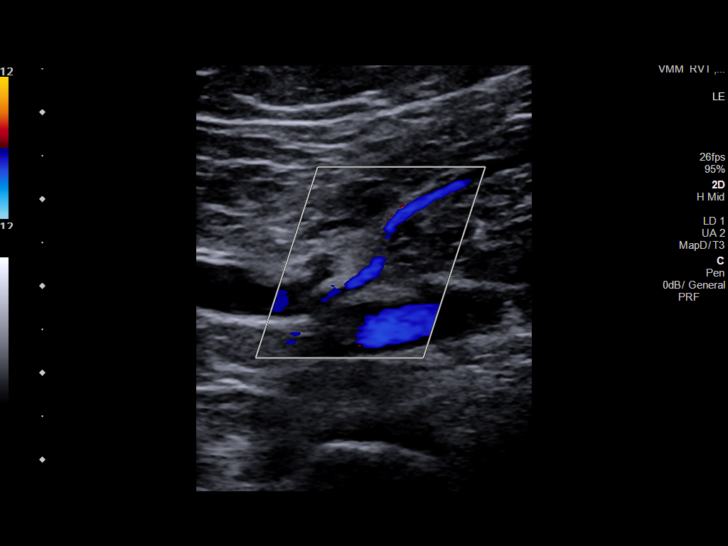
[im 34/38]
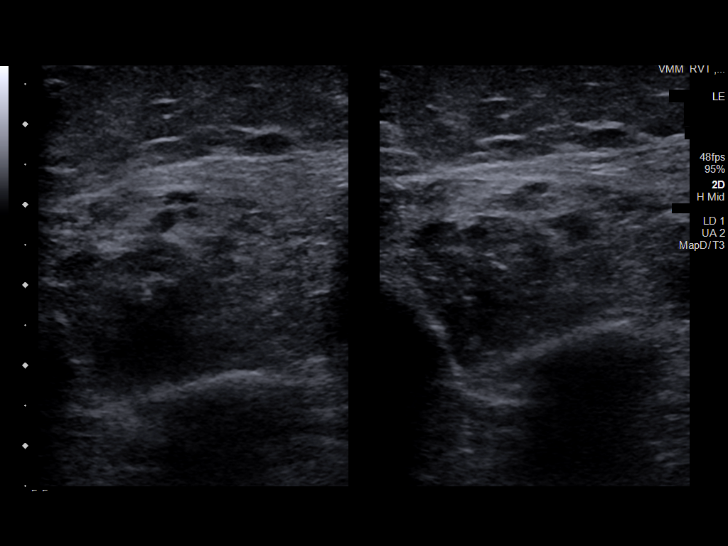
[im 38/38]
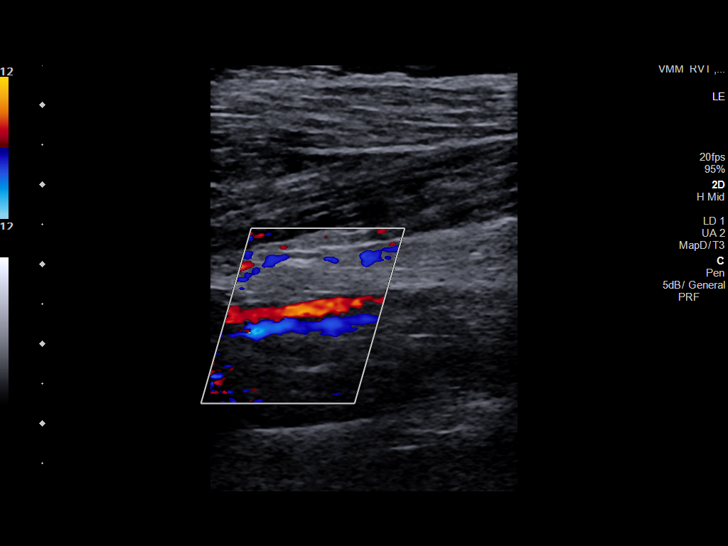

[13 of 24 positions shown; findings below may reference images not displayed]

FINDINGS: Contralateral Common Femoral Vein: Respiratory phasicity is normal
and symmetric with the symptomatic side. No evidence of thrombus.
Normal compressibility.

Common Femoral Vein: No evidence of thrombus. Normal
compressibility, respiratory phasicity and response to augmentation.

Saphenofemoral Junction: No evidence of thrombus. Normal
compressibility and flow on color Doppler imaging.

Profunda Femoral Vein: No evidence of thrombus. Normal
compressibility and flow on color Doppler imaging.

Femoral Vein: No evidence of thrombus. Normal compressibility,
respiratory phasicity and response to augmentation.

Popliteal Vein: No evidence of thrombus. Normal compressibility,
respiratory phasicity and response to augmentation.

Calf Veins: No evidence of thrombus. Normal compressibility and flow
on color Doppler imaging.

Superficial Great Saphenous Vein: No evidence of thrombus. Normal
compressibility and flow on color Doppler imaging.

Other Findings:  None.
IMPRESSION: Sonographic survey of the right lower extremity negative for DVT

## 2022-10-21 ENCOUNTER — Ambulatory Visit
Admission: EM | Admit: 2022-10-21 | Discharge: 2022-10-21 | Disposition: A | Discharge status: Home / Self Care | Payer: BC Managed Care – PPO | Attending: Family Medicine | Admitting: Family Medicine

## 2022-10-21 DIAGNOSIS — H6593 Unspecified nonsuppurative otitis media, bilateral: Secondary | ICD-10-CM | POA: Diagnosis not present

## 2022-10-21 DIAGNOSIS — J069 Acute upper respiratory infection, unspecified: Secondary | ICD-10-CM

## 2022-10-21 LAB — POCT RAPID STREP A (OFFICE): Rapid Strep A Screen: NEGATIVE

## 2022-10-21 MED ORDER — PROMETHAZINE-DM 6.25-15 MG/5ML PO SYRP: 5.0000 mL | ORAL_SOLUTION | Freq: Four times a day (QID) | ORAL | 0 refills | Status: AC | PRN

## 2022-10-21 MED ORDER — PREDNISONE 50 MG PO TABS: ORAL_TABLET | ORAL | 0 refills | Status: AC

## 2022-10-21 NOTE — ED Triage Notes (Signed)
Pt reports she has throat pain, ear pain, coughing, headache, and low fever x 1 week And rash on face
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
## 2023-06-20 ENCOUNTER — Ambulatory Visit
Admission: EM | Admit: 2023-06-20 | Discharge: 2023-06-20 | Disposition: A | Discharge status: Home / Self Care | Payer: Self-pay | Attending: Family Medicine | Admitting: Family Medicine

## 2023-06-20 DIAGNOSIS — J069 Acute upper respiratory infection, unspecified: Secondary | ICD-10-CM

## 2023-06-20 DIAGNOSIS — J3089 Other allergic rhinitis: Secondary | ICD-10-CM | POA: Diagnosis not present

## 2023-06-20 DIAGNOSIS — H65191 Other acute nonsuppurative otitis media, right ear: Secondary | ICD-10-CM

## 2023-06-20 LAB — POCT RAPID STREP A (OFFICE): Rapid Strep A Screen: NEGATIVE

## 2023-06-20 MED ORDER — LIDOCAINE VISCOUS HCL 2 % MT SOLN: 10.0000 mL | OROMUCOSAL | 0 refills | Status: AC | PRN

## 2023-06-20 MED ORDER — AZELASTINE HCL 0.1 % NA SOLN: 1.0000 | Freq: Two times a day (BID) | NASAL | 0 refills | Status: AC

## 2023-06-20 NOTE — ED Triage Notes (Signed)
 Pt reports she has right ear pain and throat pain x 2 days    Used throat spray but no relief
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
## 2023-06-20 NOTE — Discharge Instructions (Signed)
 Continue your daily allergy pill and I have sent over a good nasal spray as well as some numbing liquid to gargle and spit to help with your sore throat.  You may also take over-the-counter decongestant such as Coricidin HBP.  Follow-up for worsening symptoms

## 2023-07-21 ENCOUNTER — Ambulatory Visit (HOSPITAL_COMMUNITY)
Admission: RE | Admit: 2023-07-21 | Discharge: 2023-07-21 | Disposition: A | Discharge status: Home / Self Care | Source: Ambulatory Visit | Attending: Family Medicine | Admitting: Family Medicine

## 2023-07-21 ENCOUNTER — Other Ambulatory Visit (HOSPITAL_COMMUNITY): Payer: Self-pay | Admitting: Family Medicine

## 2023-07-21 DIAGNOSIS — R509 Fever, unspecified: Secondary | ICD-10-CM | POA: Insufficient documentation

## 2023-11-18 ENCOUNTER — Other Ambulatory Visit (HOSPITAL_COMMUNITY): Payer: Self-pay | Admitting: Family Medicine

## 2023-11-18 ENCOUNTER — Ambulatory Visit (HOSPITAL_COMMUNITY)
Admission: RE | Admit: 2023-11-18 | Discharge: 2023-11-18 | Disposition: A | Discharge status: Home / Self Care | Source: Ambulatory Visit | Attending: Family Medicine | Admitting: Family Medicine

## 2023-11-18 DIAGNOSIS — J189 Pneumonia, unspecified organism: Secondary | ICD-10-CM | POA: Diagnosis present

## 2023-12-08 ENCOUNTER — Encounter: Payer: Self-pay | Admitting: Radiology

## 2023-12-25 ENCOUNTER — Ambulatory Visit (HOSPITAL_BASED_OUTPATIENT_CLINIC_OR_DEPARTMENT_OTHER): Admitting: Orthopaedic Surgery

## 2024-02-11 ENCOUNTER — Ambulatory Visit (HOSPITAL_BASED_OUTPATIENT_CLINIC_OR_DEPARTMENT_OTHER)

## 2024-02-11 ENCOUNTER — Ambulatory Visit (HOSPITAL_BASED_OUTPATIENT_CLINIC_OR_DEPARTMENT_OTHER): Admitting: Orthopaedic Surgery

## 2024-02-11 DIAGNOSIS — M7061 Trochanteric bursitis, right hip: Secondary | ICD-10-CM | POA: Diagnosis not present

## 2024-02-11 DIAGNOSIS — M7062 Trochanteric bursitis, left hip: Secondary | ICD-10-CM
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
## 2024-05-12 ENCOUNTER — Ambulatory Visit (HOSPITAL_BASED_OUTPATIENT_CLINIC_OR_DEPARTMENT_OTHER): Admitting: Orthopaedic Surgery
